# Patient Record
Sex: Male | Born: 1960 | Race: Black or African American | Hispanic: No | Marital: Single | Smoking: Current every day smoker
Health system: Southern US, Community
[De-identification: ages and names within clinical notes are randomized; demographics above are authoritative.]

## PROBLEM LIST (undated history)

## (undated) DIAGNOSIS — E119 Type 2 diabetes mellitus without complications: Secondary | ICD-10-CM

## (undated) DIAGNOSIS — I10 Essential (primary) hypertension: Secondary | ICD-10-CM

## (undated) DIAGNOSIS — B20 Human immunodeficiency virus [HIV] disease: Secondary | ICD-10-CM

## (undated) DIAGNOSIS — Z21 Asymptomatic human immunodeficiency virus [HIV] infection status: Secondary | ICD-10-CM

---

## 2001-03-19 ENCOUNTER — Emergency Department (HOSPITAL_COMMUNITY): Admission: EM | Admit: 2001-03-19 | Discharge: 2001-03-19 | Payer: Self-pay | Admitting: Emergency Medicine

## 2001-03-19 ENCOUNTER — Encounter: Payer: Self-pay | Admitting: Emergency Medicine

## 2001-05-28 ENCOUNTER — Emergency Department (HOSPITAL_COMMUNITY): Admission: EM | Admit: 2001-05-28 | Discharge: 2001-05-28 | Payer: Self-pay | Admitting: Emergency Medicine

## 2001-11-17 ENCOUNTER — Emergency Department (HOSPITAL_COMMUNITY): Admission: EM | Admit: 2001-11-17 | Discharge: 2001-11-17 | Payer: Self-pay | Admitting: *Deleted

## 2001-12-29 ENCOUNTER — Inpatient Hospital Stay (HOSPITAL_COMMUNITY): Admission: EM | Admit: 2001-12-29 | Discharge: 2002-01-08 | Payer: Self-pay | Admitting: Emergency Medicine

## 2001-12-29 ENCOUNTER — Encounter: Payer: Self-pay | Admitting: Emergency Medicine

## 2002-01-01 ENCOUNTER — Encounter: Payer: Self-pay | Admitting: Internal Medicine

## 2002-02-13 ENCOUNTER — Encounter: Admission: RE | Admit: 2002-02-13 | Discharge: 2002-02-13 | Payer: Self-pay | Admitting: Internal Medicine

## 2002-04-04 ENCOUNTER — Encounter: Admission: RE | Admit: 2002-04-04 | Discharge: 2002-04-04 | Payer: Self-pay | Admitting: Internal Medicine

## 2005-07-27 ENCOUNTER — Ambulatory Visit: Payer: Self-pay | Admitting: Specialist

## 2005-12-10 ENCOUNTER — Ambulatory Visit: Payer: Self-pay | Admitting: Specialist

## 2005-12-13 ENCOUNTER — Inpatient Hospital Stay: Payer: Self-pay | Admitting: Specialist

## 2006-12-28 ENCOUNTER — Emergency Department: Payer: Self-pay

## 2007-03-22 ENCOUNTER — Ambulatory Visit: Payer: Self-pay | Admitting: Specialist

## 2008-01-25 ENCOUNTER — Ambulatory Visit: Payer: Self-pay | Admitting: Specialist

## 2008-03-20 ENCOUNTER — Inpatient Hospital Stay: Payer: Self-pay | Admitting: Internal Medicine

## 2009-04-23 ENCOUNTER — Emergency Department: Payer: Self-pay | Admitting: Emergency Medicine

## 2009-07-14 ENCOUNTER — Ambulatory Visit: Payer: Self-pay | Admitting: Internal Medicine

## 2009-08-01 ENCOUNTER — Emergency Department: Payer: Self-pay | Admitting: Emergency Medicine

## 2010-02-12 ENCOUNTER — Ambulatory Visit: Payer: Self-pay | Admitting: Internal Medicine

## 2010-03-03 ENCOUNTER — Inpatient Hospital Stay: Payer: Self-pay | Admitting: Internal Medicine

## 2010-03-07 ENCOUNTER — Ambulatory Visit: Payer: Self-pay | Admitting: Internal Medicine

## 2010-03-15 ENCOUNTER — Ambulatory Visit: Payer: Self-pay | Admitting: Internal Medicine

## 2010-07-08 ENCOUNTER — Emergency Department: Payer: Self-pay | Admitting: Emergency Medicine

## 2011-02-26 ENCOUNTER — Inpatient Hospital Stay: Payer: Self-pay | Admitting: Psychiatry

## 2011-05-22 IMAGING — CR DG CHEST 2V
1 series · 2 of 2 positions shown · non-contrast
Comparison: none

REASON FOR EXAM: fever
COMMENTS:

PROCEDURE:     DXR - DXR CHEST PA (OR AP) AND LATERAL  - March 03, 2010  [DATE]
RESULT:     Comparison: 03/20/2008

[Series 1: view not recorded · 0.17mm/px · 2 of 2 slices shown]
[im 1/2]
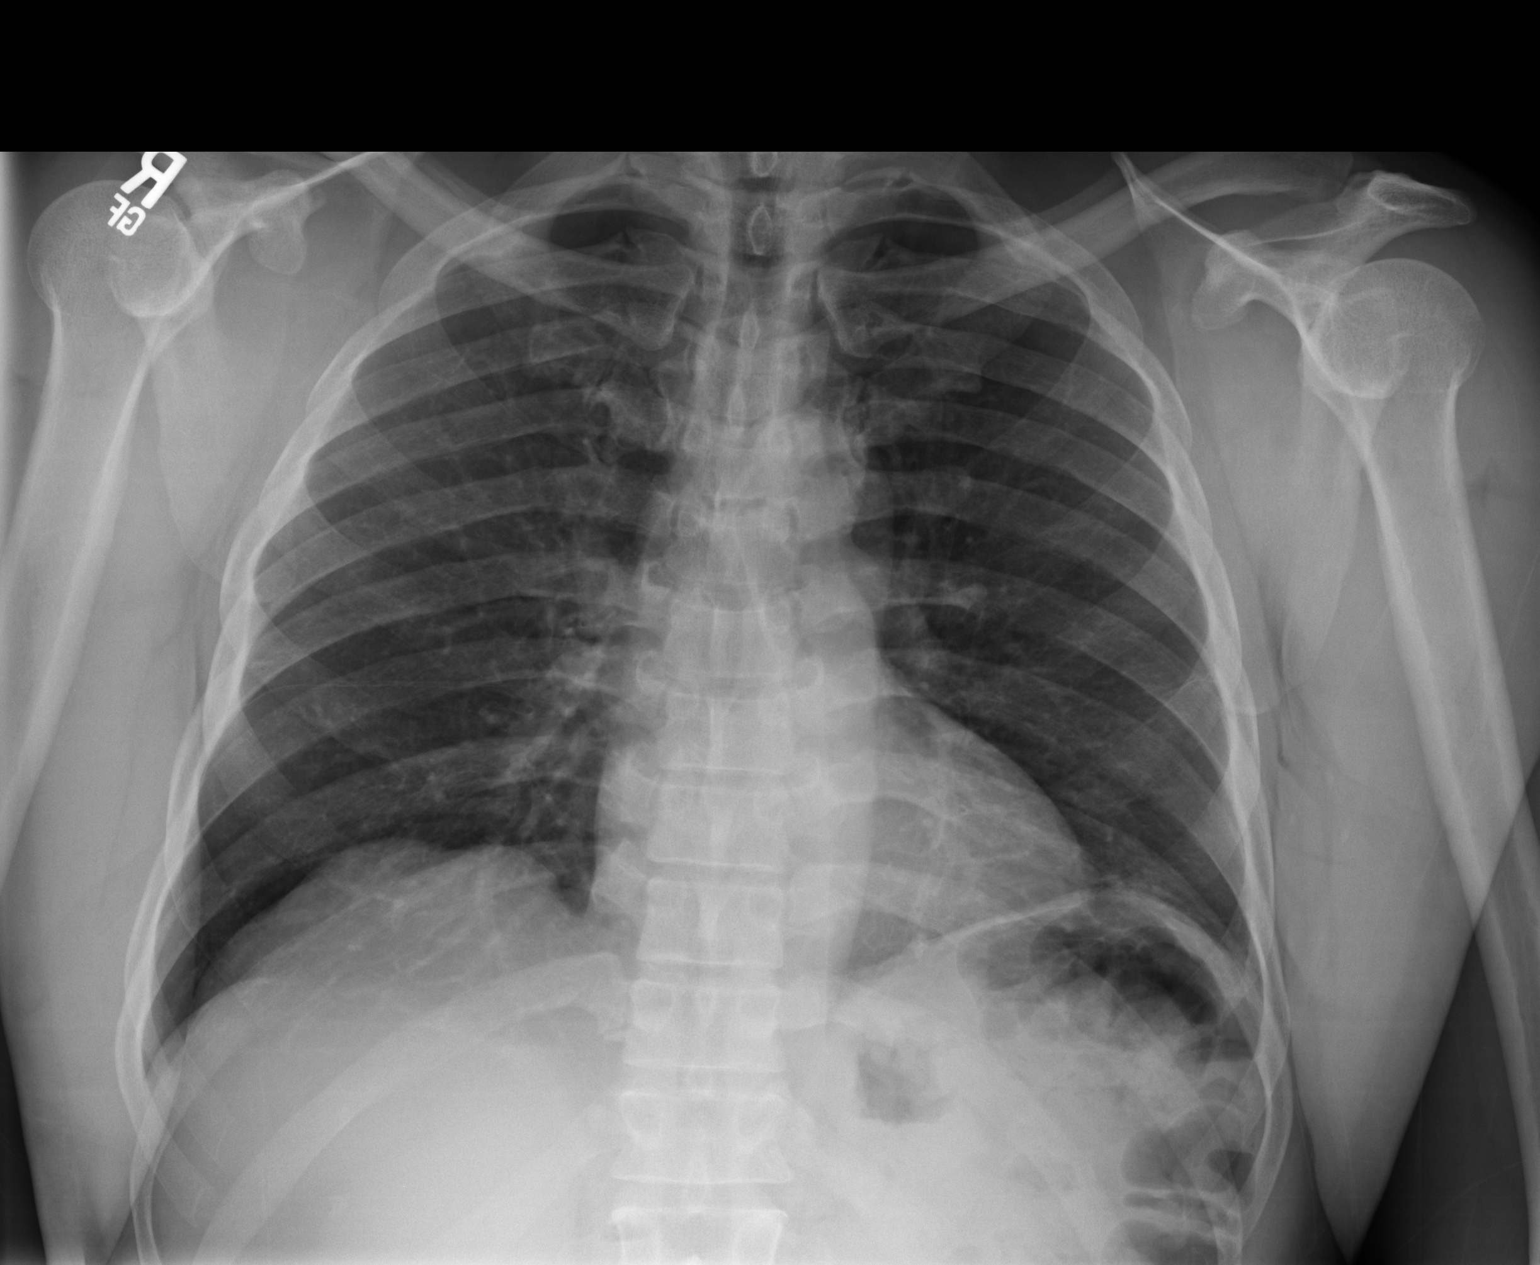
[im 2/2]
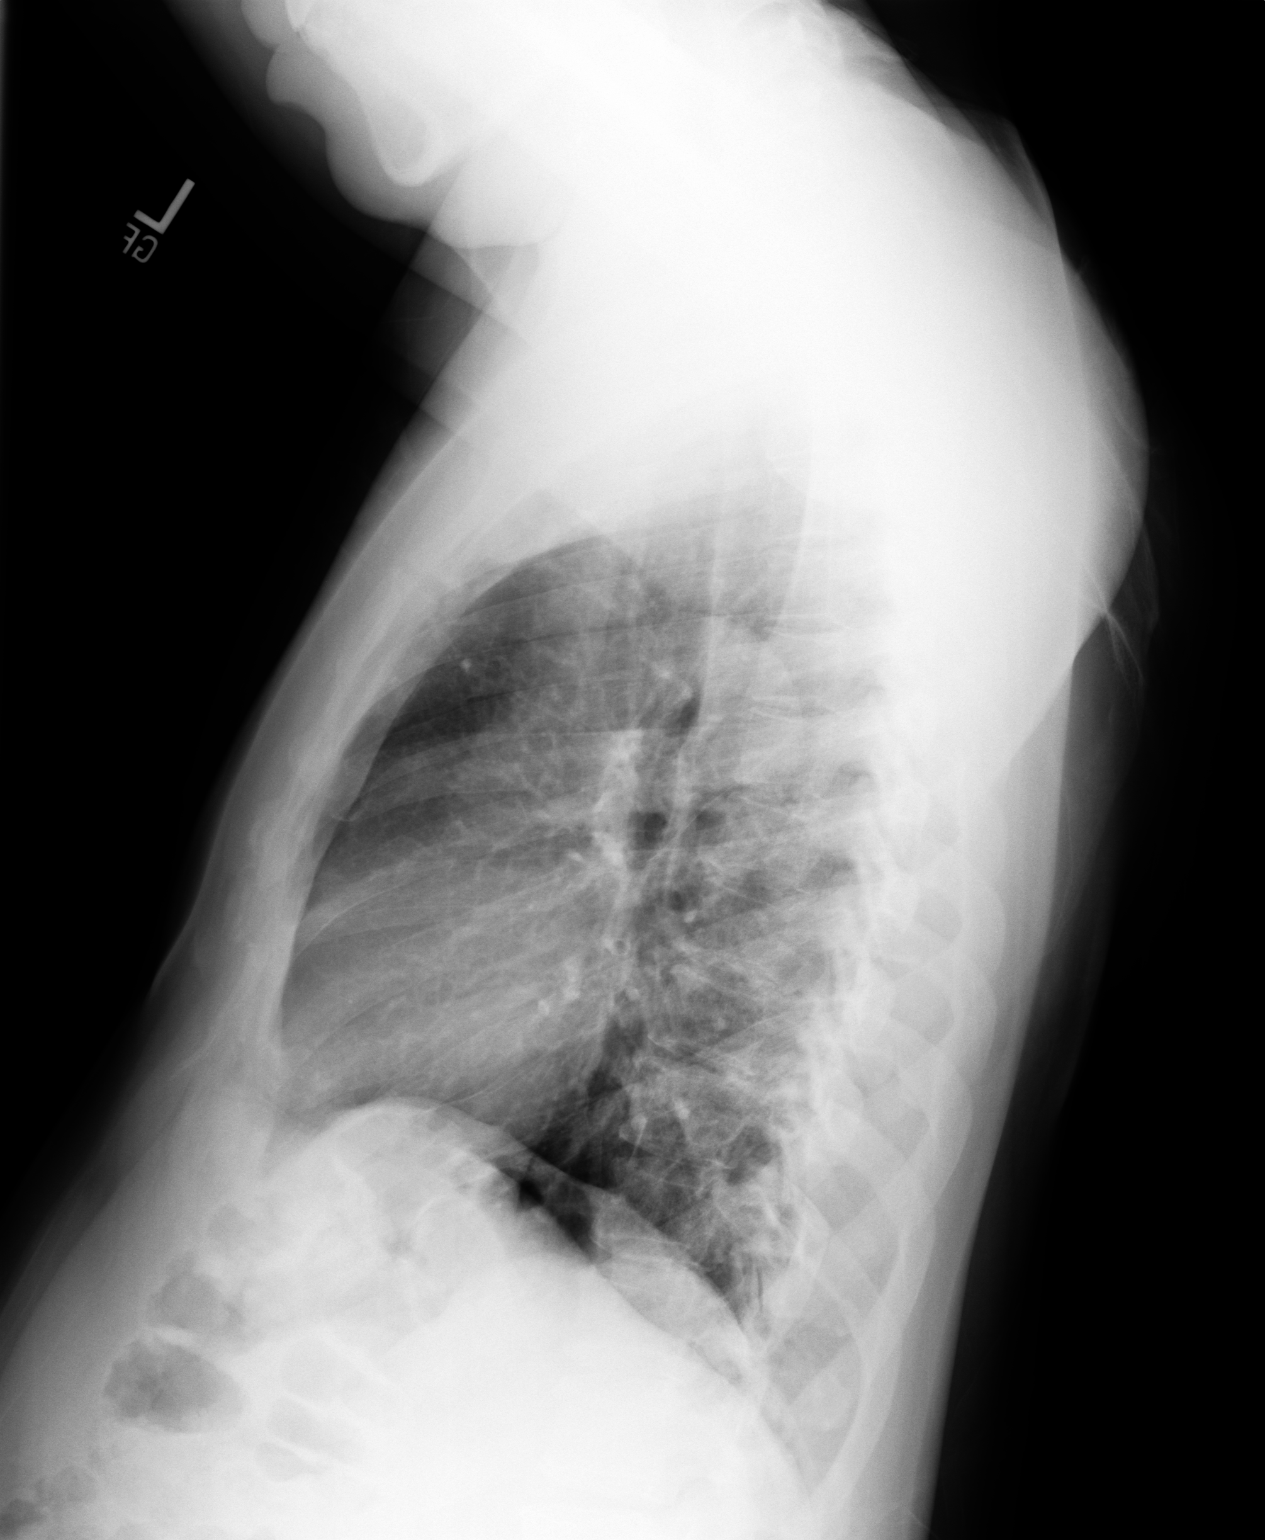

[2 of 2 positions shown; findings below may reference images not displayed]

FINDINGS: Heart and mediastinum are stable. Minimal linear left basilar opacities
likely represent subsegmental atelectasis. Otherwise, the lungs are clear.
IMPRESSION: Minimal left basilar subsegmental atelectasis.

## 2011-06-07 ENCOUNTER — Emergency Department: Payer: Self-pay

## 2011-06-07 LAB — URINALYSIS, COMPLETE
Bacteria: NONE SEEN
Bilirubin,UR: NEGATIVE
Glucose,UR: >=500 mg/dL (ref 0–75)
Ketone: NEGATIVE
Leukocyte Esterase: NEGATIVE
Nitrite: NEGATIVE
Ph: 5 (ref 4.5–8.0)
Protein: NEGATIVE
RBC,UR: <1 /HPF (ref 0–5)
Specific Gravity: 1.027 (ref 1.003–1.030)
Squamous Epithelial: NONE SEEN
WBC UR: 1 /HPF (ref 0–5)

## 2011-06-07 LAB — CBC
HCT: 39.1 % — ABNORMAL LOW (ref 40.0–52.0)
HGB: 13.5 g/dL (ref 13.0–18.0)
MCH: 33.6 pg (ref 26.0–34.0)
MCHC: 34.4 g/dL (ref 32.0–36.0)
MCV: 98 fL (ref 80–100)
Platelet: 223 10*3/uL (ref 150–440)
RBC: 4 10*6/uL — ABNORMAL LOW (ref 4.40–5.90)
RDW: 13.8 % (ref 11.5–14.5)
WBC: 5.4 10*3/uL (ref 3.8–10.6)

## 2011-06-07 LAB — COMPREHENSIVE METABOLIC PANEL
Albumin: 3.8 g/dL (ref 3.4–5.0)
Alkaline Phosphatase: 106 U/L (ref 50–136)
Anion Gap: 16 (ref 7–16)
BUN: 22 mg/dL — ABNORMAL HIGH (ref 7–18)
Bilirubin,Total: 0.2 mg/dL (ref 0.2–1.0)
Calcium, Total: 9 mg/dL (ref 8.5–10.1)
Chloride: 98 mmol/L (ref 98–107)
Co2: 22 mmol/L (ref 21–32)
Creatinine: 1.33 mg/dL — ABNORMAL HIGH (ref 0.60–1.30)
EGFR (African American): >60
EGFR (Non-African Amer.): >60
Glucose: 535 mg/dL (ref 65–99)
Osmolality: 300 (ref 275–301)
Potassium: 4 mmol/L (ref 3.5–5.1)
SGOT(AST): 31 U/L (ref 15–37)
SGPT (ALT): 30 U/L
Sodium: 136 mmol/L (ref 136–145)
Total Protein: 7.8 g/dL (ref 6.4–8.2)

## 2015-11-13 ENCOUNTER — Other Ambulatory Visit: Payer: Self-pay

## 2015-11-13 ENCOUNTER — Other Ambulatory Visit (HOSPITAL_COMMUNITY)
Admission: RE | Admit: 2015-11-13 | Discharge: 2015-11-13 | Disposition: A | Discharge status: Home / Self Care | Payer: Medicare Other | Source: Ambulatory Visit | Attending: Internal Medicine | Admitting: Internal Medicine

## 2015-11-13 DIAGNOSIS — Z79899 Other long term (current) drug therapy: Secondary | ICD-10-CM

## 2015-11-13 DIAGNOSIS — Z113 Encounter for screening for infections with a predominantly sexual mode of transmission: Secondary | ICD-10-CM | POA: Diagnosis present

## 2015-11-13 DIAGNOSIS — B20 Human immunodeficiency virus [HIV] disease: Secondary | ICD-10-CM

## 2015-11-14 LAB — CBC WITH DIFFERENTIAL/PLATELET
Basophils Absolute: 0 cells/uL (ref 0–200)
Basophils Relative: 0 %
EOS PCT: 0 %
Eosinophils Absolute: 0 cells/uL — ABNORMAL LOW (ref 15–500)
HEMATOCRIT: 52.8 % — AB (ref 38.5–50.0)
Hemoglobin: 18.6 g/dL — ABNORMAL HIGH (ref 13.2–17.1)
LYMPHS PCT: 42 %
Lymphs Abs: 2016 cells/uL (ref 850–3900)
MCH: 34.1 pg — ABNORMAL HIGH (ref 27.0–33.0)
MCHC: 35.2 g/dL (ref 32.0–36.0)
MCV: 96.9 fL (ref 80.0–100.0)
MONOS PCT: 8 %
MPV: 10 fL (ref 7.5–12.5)
Monocytes Absolute: 384 cells/uL (ref 200–950)
NEUTROS PCT: 50 %
Neutro Abs: 2400 cells/uL (ref 1500–7800)
PLATELETS: 309 10*3/uL (ref 140–400)
RBC: 5.45 MIL/uL (ref 4.20–5.80)
RDW: 13.8 % (ref 11.0–15.0)
WBC: 4.8 10*3/uL (ref 3.8–10.8)

## 2015-11-14 LAB — URINALYSIS
Bilirubin Urine: NEGATIVE
HGB URINE DIPSTICK: NEGATIVE
KETONES UR: NEGATIVE
Leukocytes, UA: NEGATIVE
NITRITE: NEGATIVE
PH: 5 (ref 5.0–8.0)
Specific Gravity, Urine: 1.024 (ref 1.001–1.035)

## 2015-11-14 LAB — COMPLETE METABOLIC PANEL WITH GFR
ALT: 21 U/L (ref 9–46)
AST: 18 U/L (ref 10–35)
Albumin: 5 g/dL (ref 3.6–5.1)
Alkaline Phosphatase: 82 U/L (ref 40–115)
BUN: 21 mg/dL (ref 7–25)
CALCIUM: 10.3 mg/dL (ref 8.6–10.3)
CHLORIDE: 103 mmol/L (ref 98–110)
CO2: 24 mmol/L (ref 20–31)
Creat: 1.27 mg/dL (ref 0.70–1.33)
GFR, Est African American: 74 mL/min (ref >=60)
GFR, Est Non African American: 64 mL/min (ref >=60)
Glucose, Bld: 207 mg/dL — ABNORMAL HIGH (ref 65–99)
POTASSIUM: 4.3 mmol/L (ref 3.5–5.3)
SODIUM: 141 mmol/L (ref 135–146)
Total Bilirubin: 0.6 mg/dL (ref 0.2–1.2)
Total Protein: 7.8 g/dL (ref 6.1–8.1)

## 2015-11-14 LAB — HEPATITIS C ANTIBODY: HCV AB: NEGATIVE

## 2015-11-14 LAB — URINE CYTOLOGY ANCILLARY ONLY
CHLAMYDIA, DNA PROBE: NEGATIVE
NEISSERIA GONORRHEA: NEGATIVE

## 2015-11-14 LAB — RPR

## 2015-11-14 LAB — T-HELPER CELL (CD4) - (RCID CLINIC ONLY)
CD4 % Helper T Cell: 10 % — ABNORMAL LOW (ref 33–55)
CD4 T CELL ABS: 210 /uL — AB (ref 400–2700)

## 2015-11-14 LAB — LIPID PANEL
Cholesterol: 208 mg/dL — ABNORMAL HIGH (ref 125–200)
HDL: 38 mg/dL — ABNORMAL LOW (ref >=40)
LDL CALC: 118 mg/dL (ref <130)
TRIGLYCERIDES: 262 mg/dL — AB (ref <150)
Total CHOL/HDL Ratio: 5.5 Ratio — ABNORMAL HIGH (ref <=5.0)
VLDL: 52 mg/dL — ABNORMAL HIGH (ref <30)

## 2015-11-14 LAB — HIV-1 RNA ULTRAQUANT REFLEX TO GENTYP+: HIV-1 RNA Quant, Log: <1.3 Log copies/mL (ref <1.30)

## 2015-11-14 LAB — HEPATITIS B CORE ANTIBODY, TOTAL: HEP B C TOTAL AB: REACTIVE — AB

## 2015-11-14 LAB — HEPATITIS B SURFACE ANTIBODY,QUALITATIVE: Hep B S Ab: POSITIVE — AB

## 2015-11-14 LAB — HEPATITIS A ANTIBODY, TOTAL: Hep A Total Ab: REACTIVE — AB

## 2015-11-14 LAB — HEPATITIS B SURFACE ANTIGEN: Hepatitis B Surface Ag: NEGATIVE

## 2015-11-15 LAB — QUANTIFERON TB GOLD ASSAY (BLOOD)
INTERFERON GAMMA RELEASE ASSAY: NEGATIVE
Mitogen-Nil: >10 IU/mL
Quantiferon Nil Value: 0.3 IU/mL
Quantiferon Tb Ag Minus Nil Value: <0 IU/mL

## 2015-11-20 ENCOUNTER — Emergency Department (HOSPITAL_COMMUNITY)
Admission: EM | Admit: 2015-11-20 | Discharge: 2015-11-21 | Disposition: A | Discharge status: Home / Self Care | Payer: Medicare Other | Attending: Emergency Medicine | Admitting: Emergency Medicine

## 2015-11-20 ENCOUNTER — Encounter (HOSPITAL_COMMUNITY): Payer: Self-pay | Admitting: Emergency Medicine

## 2015-11-20 DIAGNOSIS — E119 Type 2 diabetes mellitus without complications: Secondary | ICD-10-CM | POA: Insufficient documentation

## 2015-11-20 DIAGNOSIS — R1030 Lower abdominal pain, unspecified: Secondary | ICD-10-CM | POA: Diagnosis present

## 2015-11-20 DIAGNOSIS — R1084 Generalized abdominal pain: Secondary | ICD-10-CM

## 2015-11-20 HISTORY — DX: Type 2 diabetes mellitus without complications: E11.9

## 2015-11-20 HISTORY — DX: Essential (primary) hypertension: I10

## 2015-11-20 LAB — LIPASE, BLOOD: Lipase: 30 U/L (ref 11–51)

## 2015-11-20 LAB — URINALYSIS, ROUTINE W REFLEX MICROSCOPIC
Bilirubin Urine: NEGATIVE
GLUCOSE, UA: 500 mg/dL — AB
HGB URINE DIPSTICK: NEGATIVE
KETONES UR: 15 mg/dL — AB
LEUKOCYTES UA: NEGATIVE
Nitrite: NEGATIVE
PH: 8 (ref 5.0–8.0)
Protein, ur: 30 mg/dL — AB
Specific Gravity, Urine: 1.025 (ref 1.005–1.030)

## 2015-11-20 LAB — URINE MICROSCOPIC-ADD ON: RBC / HPF: NONE SEEN RBC/hpf (ref 0–5)

## 2015-11-20 LAB — CBC WITH DIFFERENTIAL/PLATELET
Basophils Absolute: 0 10*3/uL (ref 0.0–0.1)
Basophils Relative: 1 %
Eosinophils Absolute: 0 10*3/uL (ref 0.0–0.7)
Eosinophils Relative: 1 %
HEMATOCRIT: 44.3 % (ref 39.0–52.0)
Hemoglobin: 15.1 g/dL (ref 13.0–17.0)
LYMPHS PCT: 31 %
Lymphs Abs: 1.2 10*3/uL (ref 0.7–4.0)
MCH: 34 pg (ref 26.0–34.0)
MCHC: 34.1 g/dL (ref 30.0–36.0)
MCV: 99.8 fL (ref 78.0–100.0)
MONO ABS: 0.5 10*3/uL (ref 0.1–1.0)
MONOS PCT: 14 %
NEUTROS ABS: 2.1 10*3/uL (ref 1.7–7.7)
Neutrophils Relative %: 53 %
Platelets: 217 10*3/uL (ref 150–400)
RBC: 4.44 MIL/uL (ref 4.22–5.81)
RDW: 12.3 % (ref 11.5–15.5)
WBC: 3.8 10*3/uL — ABNORMAL LOW (ref 4.0–10.5)

## 2015-11-20 LAB — COMPREHENSIVE METABOLIC PANEL
ALBUMIN: 3.5 g/dL (ref 3.5–5.0)
ALK PHOS: 64 U/L (ref 38–126)
ALT: 33 U/L (ref 17–63)
ANION GAP: 6 (ref 5–15)
AST: 34 U/L (ref 15–41)
BUN: 6 mg/dL (ref 6–20)
CALCIUM: 9.1 mg/dL (ref 8.9–10.3)
CO2: 30 mmol/L (ref 22–32)
Chloride: 102 mmol/L (ref 101–111)
Creatinine, Ser: 0.97 mg/dL (ref 0.61–1.24)
GFR calc Af Amer: >60 mL/min (ref >60)
GFR calc non Af Amer: >60 mL/min (ref >60)
GLUCOSE: 215 mg/dL — AB (ref 65–99)
Potassium: 3.8 mmol/L (ref 3.5–5.1)
SODIUM: 138 mmol/L (ref 135–145)
Total Bilirubin: 0.3 mg/dL (ref 0.3–1.2)
Total Protein: 6.3 g/dL — ABNORMAL LOW (ref 6.5–8.1)

## 2015-11-20 MED ORDER — HYDROMORPHONE HCL 1 MG/ML IJ SOLN
1.0000 mg | Freq: Once | INTRAMUSCULAR | Status: AC
  Administered 2015-11-21: 1 mg via INTRAVENOUS
  Filled 2015-11-20: qty 1

## 2015-11-20 MED ORDER — ONDANSETRON HCL 4 MG/2ML IJ SOLN
4.0000 mg | Freq: Once | INTRAMUSCULAR | Status: AC
  Administered 2015-11-21: 4 mg via INTRAVENOUS
  Filled 2015-11-20: qty 2

## 2015-11-20 NOTE — ED Notes (Signed)
updated

## 2015-11-20 NOTE — ED Provider Notes (Signed)
[facility]-EMERGENCY DEPT Provider Note   CSN: [id number] Arrival date & time: 11/20/15  1507  By signing my name below, I, Clovis PuAvnee Patel, attest that this documentation has been prepared under the direction and in the presence of Gilda Creasehristopher J Ashli Selders, MD  Electronically Signed: Clovis PuAvnee Patel, ED Scribe. 11/20/15. 11:33 PM.    History   Chief Complaint Chief Complaint  Patient presents with  . Abdominal Pain  . Constipation     The history is provided by the patient. No language interpreter was used.    HPI Comments:  Devin Rivera is a 55 y.o. male who presents to the Emergency Department complaining of acute onset, constant central, upper abdominal pain onset 2 days ago. Pt also describes a shooting pain around his lower abdomen. Pt states the lower abdominal pain radiates to his back. Pt reports associated change in BM but is unsure if he has constipation. Pt denies a hx of abdominal surgeries and vomiting. No alleviating factors noted.     Past Medical History:  Diagnosis Date  . Diabetes mellitus without complication (HCC)     There are no active problems to display for this patient.   No past surgical history on file.  OB History    No data available       Home Medications    Prior to Admission medications   Not on File    Family History No family history on file.  Social History Social History  Substance Use Topics  . Smoking status: Not on file  . Smokeless tobacco: Not on file  . Alcohol use Not on file     Allergies   Glipizide and Metformin and related   Review of Systems Review of Systems  Constitutional: Negative for fever.  Gastrointestinal: Positive for abdominal pain and constipation. Negative for vomiting.  Skin: Positive for rash.  All other systems reviewed and are negative.    Physical Exam Updated Vital Signs BP (!) 189/105 (BP Location: Right Arm)   Pulse 62   Temp 98 F (36.7 C) (Oral)   Resp 16   Ht 5\' 4"  (1.626  m)   Wt 158 lb (71.7 kg)   SpO2 99%   BMI 27.12 kg/m   Physical Exam  Constitutional: He is oriented to person, place, and time. He appears well-developed and well-nourished. No distress.  HENT:  Head: Normocephalic and atraumatic.  Right Ear: Hearing normal.  Left Ear: Hearing normal.  Nose: Nose normal.  Mouth/Throat: Oropharynx is clear and moist and mucous membranes are normal.  Eyes: Conjunctivae and EOM are normal. Pupils are equal, round, and reactive to light.  Neck: Normal range of motion. Neck supple.  Cardiovascular: Regular rhythm, S1 normal and S2 normal.  Exam reveals no gallop and no friction rub.   No murmur heard. Pulmonary/Chest: Effort normal and breath sounds normal. No respiratory distress. He exhibits no tenderness.  Abdominal: Soft. Normal appearance and bowel sounds are normal. There is no hepatosplenomegaly. There is no tenderness. There is no rebound, no guarding, no tenderness at McBurney's point and negative Murphy's sign. No hernia.  mildy distended and diffusely tender   Musculoskeletal: Normal range of motion.  Neurological: He is alert and oriented to person, place, and time. He has normal strength. No cranial nerve deficit or sensory deficit. Coordination normal. GCS eye subscore is 4. GCS verbal subscore is 5. GCS motor subscore is 6.  Skin: Skin is warm, dry and intact. There is erythema. No cyanosis.  Small erythematous papules  with excoriations on the scalp.   Psychiatric: He has a normal mood and affect. His speech is normal and behavior is normal. Thought content normal.  Nursing note and vitals reviewed.    ED Treatments / Results  DIAGNOSTIC STUDIES:  Oxygen Saturation is 99% on RA, normal by my interpretation.    COORDINATION OF CARE:  11:28 PM Discussed treatment plan with pt at bedside and pt agreed to plan.  Labs (all labs ordered are listed, but only abnormal results are displayed) Labs Reviewed  COMPREHENSIVE METABOLIC PANEL -  Abnormal; Notable for the following:       Result Value   Glucose, Bld 215 (*)    Total Protein 6.3 (*)    All other components within normal limits  URINALYSIS, ROUTINE W REFLEX MICROSCOPIC (NOT AT [facility]) - Abnormal; Notable for the following:    APPearance CLOUDY (*)    Glucose, UA 500 (*)    Ketones, ur 15 (*)    Protein, ur 30 (*)    All other components within normal limits  CBC WITH DIFFERENTIAL/PLATELET - Abnormal; Notable for the following:    WBC 3.8 (*)    All other components within normal limits  URINE MICROSCOPIC-ADD ON - Abnormal; Notable for the following:    Squamous Epithelial / LPF 0-5 (*)    Bacteria, UA FEW (*)    All other components within normal limits  LIPASE, BLOOD    EKG  EKG Interpretation None       Radiology No results found.  Procedures Procedures (including critical care time)  Medications Ordered in ED Medications - No data to display   Initial Impression / Assessment and Plan / ED Course  I have reviewed the triage vital signs and the nursing notes.  Pertinent labs & imaging results that were available during my care of the patient were reviewed by me and considered in my medical decision making (see chart for details).  Clinical Course   Patient presents to the emergency part for evaluation of abdominal pain has been ongoing for 2 days. He reports that he has not been eating much, but has also noticed that he has not had a bowel movement. He was not sure if he was constipated, took milk of magnesia without improvement. Patient did have diffuse tenderness on examination. Lab work was normal. CT scan was therefore performed. No acute abnormality noted. I did review the images myself, no signs of constipation. Does have a lot of gas throughout is likely causing some of the pain.  Final Clinical Impressions(s) / ED Diagnoses   Final diagnoses:  None  Abdominal Pain  New Prescriptions New Prescriptions   No medications on file  I  personally performed the services described in this documentation, which was scribed in my presence. The recorded information has been reviewed and is accurate.     Gilda Crease, MD 11/21/15 [phone]

## 2015-11-20 NOTE — ED Triage Notes (Signed)
Pt reports constant generalized abdominal pain for two days. Pt reports he is also constipated, last BM was this morning. Pt reports it was a small bm with dark stools. Denies nausea. Pt reports taking milk of magnesia without relief 12 hours ago.

## 2015-11-21 ENCOUNTER — Encounter (HOSPITAL_COMMUNITY): Payer: Self-pay | Admitting: Radiology

## 2015-11-21 ENCOUNTER — Emergency Department (HOSPITAL_COMMUNITY): Payer: Medicare Other

## 2015-11-21 DIAGNOSIS — R1030 Lower abdominal pain, unspecified: Secondary | ICD-10-CM | POA: Diagnosis not present

## 2015-11-21 LAB — HLA B*5701: HLA-B*5701 w/rflx HLA-B High: NEGATIVE

## 2015-11-21 MED ORDER — SIMETHICONE 80 MG PO CHEW: 80.0000 mg | CHEWABLE_TABLET | Freq: Four times a day (QID) | ORAL | 0 refills | Status: AC | PRN

## 2015-11-21 MED ORDER — TRAMADOL HCL 50 MG PO TABS: 50.0000 mg | ORAL_TABLET | Freq: Four times a day (QID) | ORAL | 0 refills | Status: AC | PRN

## 2015-11-21 MED ORDER — IOPAMIDOL (ISOVUE-300) INJECTION 61%
INTRAVENOUS | Status: AC
  Administered 2015-11-21: 100 mL
  Filled 2015-11-21: qty 100

## 2015-11-21 NOTE — ED Notes (Signed)
Patient left at this time with all belongings. 

## 2015-11-27 ENCOUNTER — Encounter: Payer: Self-pay | Admitting: Internal Medicine

## 2015-11-27 ENCOUNTER — Ambulatory Visit (INDEPENDENT_AMBULATORY_CARE_PROVIDER_SITE_OTHER): Payer: Medicare Other | Admitting: Internal Medicine

## 2015-11-27 DIAGNOSIS — N529 Male erectile dysfunction, unspecified: Secondary | ICD-10-CM | POA: Diagnosis not present

## 2015-11-27 DIAGNOSIS — B20 Human immunodeficiency virus [HIV] disease: Secondary | ICD-10-CM | POA: Diagnosis not present

## 2015-11-27 MED ORDER — ELVITEG-COBIC-EMTRICIT-TENOFAF 150-150-200-10 MG PO TABS: 1.0000 | ORAL_TABLET | Freq: Every day | ORAL | 5 refills | Status: DC

## 2015-11-27 NOTE — Progress Notes (Signed)
Patient ID: Devin Rivera, male    DOB: Jul 17, 1960, 55 y.o.   MRN: [id number]  Reason for visit: to establish care as a new patient with HIV  HPI:   Patient was first diagnosed in [id number].  He was tested as part of illness then.  He says he was started then on AZT alone and has been on multiple regimens in the past including Sustiva with an unknown medication, older medications and most recently for the past 3 years has been on Prezista/norvir/Truvada.  [facility] and then was only [place] [facility] before coming here at their insistence.  He tells me he goes through periods of depression and stops his medications for 2-3 months before going back on them.  He did that recently and his viral load in July was 200 copies.  Now his CD4 is 210 and viral load undetectable.  No recent illnesses.  He is hopeful to get medication for ED but was told he needed me to review the interaction on a booster like norvir.    Past Medical History:  Diagnosis Date  . Diabetes mellitus without complication (HCC)   . Hypertension     Prior to Admission medications   Medication Sig Start Date End Date Taking? Authorizing Provider  enalapril (VASOTEC) 20 MG tablet Take 20 mg by mouth daily.   Yes Historical Provider, MD  hydrochlorothiazide (HYDRODIURIL) 25 MG tablet Take 25 mg by mouth daily. Unsure of dose   Yes Historical Provider, MD  insulin NPH-regular Human (NOVOLIN 70/30) (70-30) 100 UNIT/ML injection Inject into the skin.   Yes Historical Provider, MD  elvitegravir-cobicistat-emtricitabine-tenofovir (Devin Rivera) 150-150-200-10 MG TABS tablet Take 1 tablet by mouth daily. 11/27/15   Gardiner Barefootobert W Gazella Anglin, MD  simethicone (GAS-X) 80 MG chewable tablet Chew 1 tablet (80 mg total) by mouth every 6 (six) hours as needed (abd pain). Patient not taking: Reported on 11/27/2015 11/21/15   Gilda Creasehristopher J Pollina, MD  traMADol (ULTRAM) 50 MG tablet Take 1 tablet (50 mg total) by mouth  every 6 (six) hours as needed for moderate pain. 11/21/15   Gilda Creasehristopher J Pollina, MD    Allergies  Allergen Reactions  . Glipizide   . Metformin And Related     Social History  Substance Use Topics  . Smoking status: Current Every Day Smoker    Packs/day: 0.50    Start date: 03/15/1985  . Smokeless tobacco: Not on file  . Alcohol use Yes     Comment: occasional    FMHx: does not know his father but thinks there was DM; no renal disease or diabetes on mothers side  Review of Systems Constitutional: negative for malaise and anorexia Gastrointestinal: negative for diarrhea Integument/breast: negative for rash Musculoskeletal: negative for myalgias and arthralgias All other systems reviewed and are negative   CONSTITUTIONAL:in no apparent distress and alert  Vitals:   11/27/15 1356  BP: (!) 155/103  Pulse: 71  Temp: 97.7 F (36.5 C)   EYES: anicteric HENT: no thrush CARD:Cor RRR RESP:CTA B; normal respiratory effort QI:ONGEXGI:Bowel sounds are normal, liver is not enlarged, spleen is not enlarged MS:no pedal edema noted SKIN: no rashes NEURO: non focal  Lab Results  Component Value Date   HIV1RNAQUANT <20 11/13/2015   No components found for: HIV1GENOTYPRPLUS No components found for: THELPERCELL  Assessment: new patient here with long-standing HIV on multiple previous regimens, currently suppressed.  Discussed with patient treatment options and side effects, benefits of  treatment, long term outcomes.  I discussed the severity of untreated HIV including higher cancer risk, opportunistic infections, renal failure.  Also discussed needing to use condoms, partner disclosure, necessary vaccines, blood monitoring.  I discussed one pill a day options and need to stay compliant and potential resistance with missed doses.  All questions answered.    Plan: 1) start Devin Rivera with next refill 2) stop Prezista, norvir, truvada 3) ok for any treatment for ED including viagra on Devin Rivera -  interaction alerts that come up with this and similar ED medication are not for this drug but for infusions of NO-enhancers used for pulmonary hypertension of the same pharmacologic mechanism.  ED drugs are used safely with Devin Rivera.  RTC 2 months for follow up labs on new regimen

## 2015-12-17 ENCOUNTER — Encounter: Payer: Self-pay | Admitting: Licensed Clinical Social Worker

## 2016-01-08 ENCOUNTER — Other Ambulatory Visit: Payer: Medicare Other

## 2016-01-12 ENCOUNTER — Other Ambulatory Visit: Payer: Medicare Other

## 2016-01-14 ENCOUNTER — Other Ambulatory Visit: Payer: Medicare Other

## 2016-01-14 DIAGNOSIS — B20 Human immunodeficiency virus [HIV] disease: Secondary | ICD-10-CM

## 2016-01-14 LAB — COMPLETE METABOLIC PANEL WITH GFR
ALBUMIN: 4.4 g/dL (ref 3.6–5.1)
ALK PHOS: 77 U/L (ref 40–115)
ALT: 15 U/L (ref 9–46)
AST: 18 U/L (ref 10–35)
BILIRUBIN TOTAL: 0.5 mg/dL (ref 0.2–1.2)
BUN: 16 mg/dL (ref 7–25)
CO2: 25 mmol/L (ref 20–31)
CREATININE: 1.22 mg/dL (ref 0.70–1.33)
Calcium: 9.8 mg/dL (ref 8.6–10.3)
Chloride: 105 mmol/L (ref 98–110)
GFR, Est African American: 77 mL/min (ref >=60)
GFR, Est Non African American: 67 mL/min (ref >=60)
GLUCOSE: 150 mg/dL — AB (ref 65–99)
Potassium: 4.2 mmol/L (ref 3.5–5.3)
SODIUM: 141 mmol/L (ref 135–146)
TOTAL PROTEIN: 7 g/dL (ref 6.1–8.1)

## 2016-01-15 LAB — CBC WITH DIFFERENTIAL/PLATELET
BASOS ABS: 0 {cells}/uL (ref 0–200)
Basophils Relative: 0 %
EOS ABS: 53 {cells}/uL (ref 15–500)
EOS PCT: 1 %
HCT: 49 % (ref 38.5–50.0)
Hemoglobin: 17 g/dL (ref 13.2–17.1)
LYMPHS PCT: 43 %
Lymphs Abs: 2279 cells/uL (ref 850–3900)
MCH: 34.6 pg — AB (ref 27.0–33.0)
MCHC: 34.7 g/dL (ref 32.0–36.0)
MCV: 99.6 fL (ref 80.0–100.0)
MONOS PCT: 9 %
MPV: 9.3 fL (ref 7.5–12.5)
Monocytes Absolute: 477 cells/uL (ref 200–950)
NEUTROS PCT: 47 %
Neutro Abs: 2491 cells/uL (ref 1500–7800)
PLATELETS: 281 10*3/uL (ref 140–400)
RBC: 4.92 MIL/uL (ref 4.20–5.80)
RDW: 13.5 % (ref 11.0–15.0)
WBC: 5.3 10*3/uL (ref 3.8–10.8)

## 2016-01-15 LAB — T-HELPER CELL (CD4) - (RCID CLINIC ONLY)
CD4 T CELL ABS: 260 /uL — AB (ref 400–2700)
CD4 T CELL HELPER: 10 % — AB (ref 33–55)

## 2016-01-19 LAB — HIV-1 RNA QUANT-NO REFLEX-BLD: HIV 1 RNA Quant: <20 copies/mL (ref <20)

## 2016-02-02 ENCOUNTER — Ambulatory Visit: Payer: Medicare Other | Admitting: Internal Medicine

## 2016-07-23 ENCOUNTER — Other Ambulatory Visit: Payer: Self-pay | Admitting: Internal Medicine

## 2016-07-23 ENCOUNTER — Other Ambulatory Visit: Payer: Self-pay | Admitting: *Deleted

## 2016-07-23 MED ORDER — ELVITEG-COBIC-EMTRICIT-TENOFAF 150-150-200-10 MG PO TABS: 1.0000 | ORAL_TABLET | Freq: Every day | ORAL | 0 refills | Status: DC

## 2016-07-29 ENCOUNTER — Encounter: Payer: Self-pay | Admitting: Internal Medicine

## 2016-07-29 ENCOUNTER — Ambulatory Visit (INDEPENDENT_AMBULATORY_CARE_PROVIDER_SITE_OTHER): Payer: Medicare Other | Admitting: Internal Medicine

## 2016-07-29 VITALS — BP 200/121 | HR 69 | Temp 98.4°F | Ht 66.0 in | Wt 165.0 lb

## 2016-07-29 DIAGNOSIS — B20 Human immunodeficiency virus [HIV] disease: Secondary | ICD-10-CM | POA: Diagnosis not present

## 2016-07-29 DIAGNOSIS — I10 Essential (primary) hypertension: Secondary | ICD-10-CM | POA: Diagnosis not present

## 2016-07-29 DIAGNOSIS — Z720 Tobacco use: Secondary | ICD-10-CM | POA: Insufficient documentation

## 2016-07-29 MED ORDER — ENALAPRIL MALEATE 20 MG PO TABS: 20.0000 mg | ORAL_TABLET | Freq: Every day | ORAL | 0 refills | Status: AC

## 2016-07-29 MED ORDER — ENALAPRIL MALEATE 20 MG PO TABS: 20.0000 mg | ORAL_TABLET | Freq: Every day | ORAL | 0 refills | Status: DC

## 2016-07-29 NOTE — Progress Notes (Signed)
   Subjective:    Patient ID: Devin Rivera, male    DOB: 1960-12-17, 56 y.o.   MRN: [id number]  HPI Here for follow up of HIV. He was a new patient in September 2017 and was on Prezista, norvir and [place] and I changed him to [place].  He is pleased with this but did not come back after the change of therapy until now.  No labs prior to the visit. 2 missed doses since September.  No associated n/v/d.  Has not been on his bp medication since he did not establish with a PCP. No headache.     Review of Systems  Gastrointestinal: Negative for diarrhea.  Skin: Negative for rash.  Neurological: Negative for dizziness, light-headedness and headaches.       Objective:   Physical Exam  Constitutional: He appears well-developed and well-nourished. No distress.  HENT:  Mouth/Throat: No oropharyngeal exudate.  Eyes: No scleral icterus.  Cardiovascular: Normal rate, regular rhythm and normal heart sounds.   No murmur heard. Pulmonary/Chest: Effort normal and breath sounds normal. No respiratory distress.  Lymphadenopathy:    He has no cervical adenopathy.  Skin: No rash noted.    SH: smokes     Assessment & Plan:

## 2016-07-29 NOTE — Assessment & Plan Note (Addendum)
I advised him, particularly with his current blood pressure, that he needs to quit smoking now and is at high risk for stroke, mi He is precontemplative and not interested in any interventions.

## 2016-07-29 NOTE — Assessment & Plan Note (Signed)
His bp today is very high and I gave him 0.2 mg clonidine and refilled his enalapril which he is going to pick up today from [facility].  I asked him to stay to get his bp down some but he refused.  He is asymptomatic.  He has a new pcp appt next week.

## 2016-07-29 NOTE — Assessment & Plan Note (Signed)
Doing well.  Labs today and rtc  Months unless concerns.

## 2016-07-30 LAB — T-HELPER CELL (CD4) - (RCID CLINIC ONLY)
CD4 % Helper T Cell: 14 % — ABNORMAL LOW (ref 33–55)
CD4 T CELL ABS: 330 /uL — AB (ref 400–2700)

## 2016-07-31 LAB — HIV-1 RNA QUANT-NO REFLEX-BLD
HIV 1 RNA Quant: <20 copies/mL
HIV-1 RNA Quant, Log: <1.3 Log copies/mL

## 2016-12-21 ENCOUNTER — Other Ambulatory Visit: Payer: Self-pay | Admitting: Internal Medicine

## 2016-12-27 ENCOUNTER — Other Ambulatory Visit: Payer: Self-pay | Admitting: Internal Medicine

## 2017-02-01 ENCOUNTER — Ambulatory Visit: Payer: Medicare Other | Admitting: Internal Medicine

## 2017-03-22 ENCOUNTER — Other Ambulatory Visit: Payer: Self-pay | Admitting: Internal Medicine

## 2017-04-14 ENCOUNTER — Other Ambulatory Visit: Payer: Medicare Other

## 2017-04-14 ENCOUNTER — Other Ambulatory Visit: Payer: Self-pay | Admitting: *Deleted

## 2017-04-14 DIAGNOSIS — B20 Human immunodeficiency virus [HIV] disease: Secondary | ICD-10-CM

## 2017-04-14 DIAGNOSIS — Z113 Encounter for screening for infections with a predominantly sexual mode of transmission: Secondary | ICD-10-CM

## 2017-04-28 ENCOUNTER — Other Ambulatory Visit (HOSPITAL_COMMUNITY)
Admission: RE | Admit: 2017-04-28 | Discharge: 2017-04-28 | Disposition: A | Discharge status: Home / Self Care | Payer: Medicare Other | Source: Ambulatory Visit | Attending: Internal Medicine | Admitting: Internal Medicine

## 2017-04-28 ENCOUNTER — Ambulatory Visit (INDEPENDENT_AMBULATORY_CARE_PROVIDER_SITE_OTHER): Payer: Medicare Other | Admitting: Internal Medicine

## 2017-04-28 ENCOUNTER — Encounter: Payer: Self-pay | Admitting: Internal Medicine

## 2017-04-28 VITALS — BP 137/84 | HR 78 | Temp 98.0°F | Ht 66.0 in | Wt 161.0 lb

## 2017-04-28 DIAGNOSIS — Z79899 Other long term (current) drug therapy: Secondary | ICD-10-CM

## 2017-04-28 DIAGNOSIS — Z113 Encounter for screening for infections with a predominantly sexual mode of transmission: Secondary | ICD-10-CM

## 2017-04-28 DIAGNOSIS — Z720 Tobacco use: Secondary | ICD-10-CM | POA: Diagnosis not present

## 2017-04-28 DIAGNOSIS — B20 Human immunodeficiency virus [HIV] disease: Secondary | ICD-10-CM

## 2017-04-28 NOTE — Assessment & Plan Note (Signed)
Will screen today 

## 2017-04-28 NOTE — Progress Notes (Signed)
   Subjective:    Patient ID: Devin Rivera, male    DOB: 1960/06/26, 57 y.o.   MRN: [id number]  HPI Here for follow up of HIV. He was a new patient in September 2017 and was on Prezista, norvir and [place] and I changed him to [place].  He is pleased with this and no new issues.  Did not get labs prior to the appointment. No associated n/v/d.  Continues to see Dr. Maurice MarchBarroso in his study.  Currently suffering with a viral illness.    Review of Systems  Gastrointestinal: Negative for diarrhea.  Skin: Negative for rash.  Neurological: Negative for dizziness, light-headedness and headaches.       Objective:   Physical Exam  Constitutional: He appears well-developed and well-nourished. No distress.  HENT:  Mouth/Throat: No oropharyngeal exudate.  Eyes: No scleral icterus.  Cardiovascular: Normal rate, regular rhythm and normal heart sounds.  No murmur heard. Pulmonary/Chest: Effort normal and breath sounds normal. No respiratory distress.  Lymphadenopathy:    He has no cervical adenopathy.  Skin: No rash noted.    SH: smokes     Assessment & Plan:

## 2017-04-28 NOTE — Assessment & Plan Note (Signed)
Will check lipids today

## 2017-04-28 NOTE — Assessment & Plan Note (Signed)
Counseled.  precontemplative 

## 2017-04-28 NOTE — Assessment & Plan Note (Signed)
Doing well.  Labs today and rtc 6 months with lab prior

## 2017-04-29 ENCOUNTER — Encounter: Payer: Self-pay | Admitting: Infectious Disease

## 2017-04-29 ENCOUNTER — Telehealth: Payer: Self-pay | Admitting: *Deleted

## 2017-04-29 LAB — LIPID PANEL
CHOLESTEROL: 296 mg/dL — AB (ref <200)
HDL: 37 mg/dL — AB (ref >40)
Non-HDL Cholesterol (Calc): 259 mg/dL (calc) — ABNORMAL HIGH (ref <130)
TRIGLYCERIDES: 418 mg/dL — AB (ref <150)
Total CHOL/HDL Ratio: 8 (calc) — ABNORMAL HIGH (ref <5.0)

## 2017-04-29 LAB — CBC WITH DIFFERENTIAL/PLATELET
Basophils Absolute: 29 cells/uL (ref 0–200)
Basophils Relative: 0.6 %
EOS PCT: 0.6 %
Eosinophils Absolute: 29 cells/uL (ref 15–500)
HCT: 48 % (ref 38.5–50.0)
HEMOGLOBIN: 16.7 g/dL (ref 13.2–17.1)
Lymphs Abs: 1823 cells/uL (ref 850–3900)
MCH: 32.6 pg (ref 27.0–33.0)
MCHC: 34.8 g/dL (ref 32.0–36.0)
MCV: 93.8 fL (ref 80.0–100.0)
MONOS PCT: 8 %
MPV: 9.9 fL (ref 7.5–12.5)
NEUTROS ABS: 2626 {cells}/uL (ref 1500–7800)
Neutrophils Relative %: 53.6 %
Platelets: 368 10*3/uL (ref 140–400)
RBC: 5.12 10*6/uL (ref 4.20–5.80)
RDW: 12.3 % (ref 11.0–15.0)
Total Lymphocyte: 37.2 %
WBC mixed population: 392 cells/uL (ref 200–950)
WBC: 4.9 10*3/uL (ref 3.8–10.8)

## 2017-04-29 LAB — COMPLETE METABOLIC PANEL WITH GFR
AG Ratio: 1.5 (calc) (ref 1.0–2.5)
ALBUMIN MSPROF: 4.3 g/dL (ref 3.6–5.1)
ALT: 11 U/L (ref 9–46)
AST: 12 U/L (ref 10–35)
Alkaline phosphatase (APISO): 111 U/L (ref 40–115)
BILIRUBIN TOTAL: 0.4 mg/dL (ref 0.2–1.2)
BUN: 18 mg/dL (ref 7–25)
CALCIUM: 10.1 mg/dL (ref 8.6–10.3)
CO2: 27 mmol/L (ref 20–32)
Chloride: 97 mmol/L — ABNORMAL LOW (ref 98–110)
Creat: 1.33 mg/dL (ref 0.70–1.33)
GFR, EST AFRICAN AMERICAN: 69 mL/min/{1.73_m2} (ref >=60)
GFR, EST NON AFRICAN AMERICAN: 60 mL/min/{1.73_m2} (ref >=60)
GLOBULIN: 2.8 g/dL (ref 1.9–3.7)
Glucose, Bld: 500 mg/dL (ref 65–99)
Potassium: 4.6 mmol/L (ref 3.5–5.3)
Sodium: 135 mmol/L (ref 135–146)
TOTAL PROTEIN: 7.1 g/dL (ref 6.1–8.1)

## 2017-04-29 LAB — T-HELPER CELL (CD4) - (RCID CLINIC ONLY)
CD4 T CELL HELPER: 14 % — AB (ref 33–55)
CD4 T Cell Abs: 280 /uL — ABNORMAL LOW (ref 400–2700)

## 2017-04-29 LAB — RPR: RPR: NONREACTIVE

## 2017-04-29 LAB — URINE CYTOLOGY ANCILLARY ONLY
Chlamydia: NEGATIVE
Neisseria Gonorrhea: NEGATIVE

## 2017-04-29 NOTE — Telephone Encounter (Signed)
[facility] (PCP) to let them know of the alert blood sugar. He hasn't been to primary care since 2017. RN left message with endocrinology notifying them of his blood sugar result as well. RN called patient, left message encouraging him to follow up with his primary care [facility] [phone]).  Andree CossHowell, Zayed Griffie M, RN

## 2017-04-29 NOTE — Telephone Encounter (Signed)
-----   Message from Randall Hissornelius N Van Dam, MD sent at 04/29/2017  3:32 AM EST ----- Regarding: Bg of 500 I received phone call at 3:15 AM with re  this patient's blood glucose of 500. Please have him call his PCP today during business hrs to  have this treated. I continue to find it dysfunctional for us to answer phone calls 8 to 12 hours after labs drawn on patient on critical labs esp when it is at 3 in am

## 2017-04-29 NOTE — Telephone Encounter (Signed)
[facility] and stated he received a call from this number. Informed him of message that his blood sugar was high and that he needed to call endocrinology today. Gave him the number of [phone].

## 2017-04-29 NOTE — Progress Notes (Signed)
Received phone call from [facility] re pts BG of 500. Can we have him see PCP? This is pretty dysfunctional that we get these critical labs called at 3:15 in am to us on bloodwork drawn 8-12 hrs earlier

## 2017-05-01 LAB — HIV-1 RNA QUANT-NO REFLEX-BLD
HIV 1 RNA Quant: <20 copies/mL
HIV-1 RNA Quant, Log: <1.3 Log copies/mL

## 2017-06-27 ENCOUNTER — Other Ambulatory Visit: Payer: Self-pay | Admitting: Internal Medicine

## 2017-09-30 ENCOUNTER — Other Ambulatory Visit: Payer: Self-pay | Admitting: Internal Medicine

## 2017-10-18 ENCOUNTER — Ambulatory Visit: Payer: Medicare Other

## 2017-11-02 ENCOUNTER — Ambulatory Visit: Payer: Medicare Other | Admitting: Internal Medicine

## 2017-11-07 ENCOUNTER — Encounter (HOSPITAL_COMMUNITY): Payer: Self-pay | Admitting: Emergency Medicine

## 2017-11-07 ENCOUNTER — Emergency Department (HOSPITAL_COMMUNITY)
Admission: EM | Admit: 2017-11-07 | Discharge: 2017-11-08 | Disposition: A | Discharge status: Home / Self Care | Payer: Medicare Other | Attending: Emergency Medicine | Admitting: Emergency Medicine

## 2017-11-07 ENCOUNTER — Other Ambulatory Visit: Payer: Self-pay

## 2017-11-07 ENCOUNTER — Emergency Department (HOSPITAL_COMMUNITY): Payer: Medicare Other

## 2017-11-07 DIAGNOSIS — F17210 Nicotine dependence, cigarettes, uncomplicated: Secondary | ICD-10-CM | POA: Diagnosis not present

## 2017-11-07 DIAGNOSIS — Z794 Long term (current) use of insulin: Secondary | ICD-10-CM | POA: Diagnosis not present

## 2017-11-07 DIAGNOSIS — E1165 Type 2 diabetes mellitus with hyperglycemia: Secondary | ICD-10-CM | POA: Insufficient documentation

## 2017-11-07 DIAGNOSIS — R0602 Shortness of breath: Secondary | ICD-10-CM | POA: Diagnosis present

## 2017-11-07 DIAGNOSIS — I10 Essential (primary) hypertension: Secondary | ICD-10-CM | POA: Insufficient documentation

## 2017-11-07 DIAGNOSIS — R05 Cough: Secondary | ICD-10-CM | POA: Insufficient documentation

## 2017-11-07 DIAGNOSIS — Z79899 Other long term (current) drug therapy: Secondary | ICD-10-CM | POA: Insufficient documentation

## 2017-11-07 DIAGNOSIS — R739 Hyperglycemia, unspecified: Secondary | ICD-10-CM

## 2017-11-07 LAB — I-STAT CHEM 8, ED
BUN: 6 mg/dL (ref 6–20)
CHLORIDE: 100 mmol/L (ref 98–111)
Calcium, Ion: 1.14 mmol/L — ABNORMAL LOW (ref 1.15–1.40)
Creatinine, Ser: 0.8 mg/dL (ref 0.61–1.24)
Glucose, Bld: 300 mg/dL — ABNORMAL HIGH (ref 70–99)
HEMATOCRIT: 42 % (ref 39.0–52.0)
Hemoglobin: 14.3 g/dL (ref 13.0–17.0)
Potassium: 3.8 mmol/L (ref 3.5–5.1)
SODIUM: 135 mmol/L (ref 135–145)
TCO2: 25 mmol/L (ref 22–32)

## 2017-11-07 MED ORDER — ACETAMINOPHEN 325 MG PO TABS
650.00 | ORAL_TABLET | ORAL | Status: DC
Start: ? — End: 2017-11-07

## 2017-11-07 MED ORDER — HALOPERIDOL 5 MG PO TABS
5.00 | ORAL_TABLET | ORAL | Status: DC
Start: ? — End: 2017-11-07

## 2017-11-07 MED ORDER — HYDROXYZINE HCL 25 MG PO TABS
50.00 | ORAL_TABLET | ORAL | Status: DC
Start: ? — End: 2017-11-07

## 2017-11-07 MED ORDER — DIPHENHYDRAMINE HCL 50 MG/ML IJ SOLN
50.00 | INTRAMUSCULAR | Status: DC
Start: ? — End: 2017-11-07

## 2017-11-07 MED ORDER — ZIPRASIDONE MESYLATE 20 MG IM SOLR
20.00 | INTRAMUSCULAR | Status: DC
Start: ? — End: 2017-11-07

## 2017-11-07 MED ORDER — DEXTROSE 50 % IV SOLN
12.00 g | INTRAVENOUS | Status: DC
Start: ? — End: 2017-11-07

## 2017-11-07 MED ORDER — INSULIN LISPRO 100 UNIT/ML ~~LOC~~ SOLN
2.00 | SUBCUTANEOUS | Status: DC
Start: 2017-11-07 — End: 2017-11-07

## 2017-11-07 MED ORDER — LORAZEPAM 2 MG/ML IJ SOLN
2.00 | INTRAMUSCULAR | Status: DC
Start: ? — End: 2017-11-07

## 2017-11-07 MED ORDER — PROMETHAZINE HCL 25 MG PO TABS
25.00 | ORAL_TABLET | ORAL | Status: DC
Start: ? — End: 2017-11-07

## 2017-11-07 MED ORDER — ELVITEG-COBIC-EMTRICIT-TENOFAF 150-150-200-10 MG PO TABS
1.00 | ORAL_TABLET | ORAL | Status: DC
Start: 2017-11-08 — End: 2017-11-07

## 2017-11-07 MED ORDER — DOCUSATE SODIUM 100 MG PO CAPS
100.00 | ORAL_CAPSULE | ORAL | Status: DC
Start: ? — End: 2017-11-07

## 2017-11-07 MED ORDER — GENERIC EXTERNAL MEDICATION
5.00 | Status: DC
Start: ? — End: 2017-11-07

## 2017-11-07 MED ORDER — ALUMINUM-MAGNESIUM-SIMETHICONE 200-200-20 MG/5ML PO SUSP
30.00 | ORAL | Status: DC
Start: ? — End: 2017-11-07

## 2017-11-07 MED ORDER — LOPERAMIDE HCL 2 MG PO CAPS
2.00 | ORAL_CAPSULE | ORAL | Status: DC
Start: ? — End: 2017-11-07

## 2017-11-07 MED ORDER — TRAZODONE HCL 50 MG PO TABS
50.00 | ORAL_TABLET | ORAL | Status: DC
Start: ? — End: 2017-11-07

## 2017-11-07 MED ORDER — MIRTAZAPINE 15 MG PO TABS
7.50 | ORAL_TABLET | ORAL | Status: DC
Start: 2017-11-07 — End: 2017-11-07

## 2017-11-07 MED ORDER — SERTRALINE HCL 50 MG PO TABS
50.00 | ORAL_TABLET | ORAL | Status: DC
Start: 2017-11-08 — End: 2017-11-07

## 2017-11-07 MED ORDER — BENZTROPINE MESYLATE 1 MG PO TABS
1.00 | ORAL_TABLET | ORAL | Status: DC
Start: ? — End: 2017-11-07

## 2017-11-07 MED ORDER — NICOTINE 14 MG/24HR TD PT24
1.00 | MEDICATED_PATCH | TRANSDERMAL | Status: DC
Start: 2017-11-08 — End: 2017-11-07

## 2017-11-07 MED ORDER — CLONIDINE HCL 0.1 MG PO TABS
.10 | ORAL_TABLET | ORAL | Status: DC
Start: ? — End: 2017-11-07

## 2017-11-07 MED ORDER — GENERIC EXTERNAL MEDICATION
1.00 | Status: DC
Start: ? — End: 2017-11-07

## 2017-11-07 MED ORDER — ENALAPRIL MALEATE 10 MG PO TABS
20.00 | ORAL_TABLET | ORAL | Status: DC
Start: 2017-11-08 — End: 2017-11-07

## 2017-11-07 MED ORDER — CLOTRIMAZOLE 1 % EX CREA
TOPICAL_CREAM | CUTANEOUS | Status: DC
Start: 2017-11-07 — End: 2017-11-07

## 2017-11-07 NOTE — ED Triage Notes (Signed)
Pt presents for evaluation of HTN, states he has been out of his medication for same. C/o headache and intermittent shortness of breath. Denies chest pain.

## 2017-11-07 NOTE — ED Provider Notes (Signed)
[facility] EMERGENCY DEPARTMENT Provider Note   CSN: [id number] Arrival date & time: 11/07/17  1950     History   Chief Complaint Chief Complaint  Patient presents with  . Hypertension    HPI Devin Rivera is a 57 y.o. male.  HPI Patient presents to the emergency room for evaluation of cough and intermittent shortness of breath.  Patient states he has been bringing up some mucus and has been coughing a lot.  Patient also is concerned that his blood sugar and blood pressure were also elevated.  Patient states he is supposed to be on medications but has not been able to get them filled yet.  He has a prescription he supposed to be able to pick up tomorrow.  He denies any trouble with chest pain.  No problems with abdominal pain.  No vomiting or diarrhea.  No fevers or chills.  I attempted to use the sign language translator the patient does not read sign language.  He is able to stand and understand me if I speak loudly. Past Medical History:  Diagnosis Date  . Diabetes mellitus without complication (HCC)   . Hypertension     Patient Active Problem List   Diagnosis Date Noted  . Screening examination for venereal disease 04/28/2017  . Encounter for long-term (current) use of high-risk medication 04/28/2017  . HTN (hypertension) 07/29/2016  . Tobacco abuse 07/29/2016  . HIV infection, symptomatic (HCC) 11/27/2015  . Erectile dysfunction 11/27/2015    History reviewed. No pertinent surgical history.      Home Medications    Prior to Admission medications   Medication Sig Start Date End Date Taking? Authorizing Provider  amitriptyline (ELAVIL) 25 MG tablet Take by mouth. 06/08/13   [provider]  enalapril (VASOTEC) 20 MG tablet Take 1 tablet (20 mg total) by mouth daily. 07/29/16   Gardiner Barefoot, MD  GENVOYA 150-150-200-10 MG TABS tablet TAKE ONE TABLET BY MOUTH DAILY 09/30/17   Comer, Belia Heman, MD  hydrochlorothiazide (HYDRODIURIL)  25 MG tablet Take 25 mg by mouth daily. Unsure of dose    [provider]  insulin NPH-regular Human (NOVOLIN 70/30) (70-30) 100 UNIT/ML injection Inject into the skin.    [provider]  simethicone (GAS-X) 80 MG chewable tablet Chew 1 tablet (80 mg total) by mouth every 6 (six) hours as needed (abd pain). 11/21/15   Gilda Crease, MD  traMADol (ULTRAM) 50 MG tablet Take 1 tablet (50 mg total) by mouth every 6 (six) hours as needed for moderate pain. Patient not taking: Reported on 07/29/2016 11/21/15   Gilda Crease, MD    Family History No family history on file.  Social History Social History   Tobacco Use  . Smoking status: Current Every Day Smoker    Packs/day: 0.50    Start date: 03/15/1985  . Smokeless tobacco: Never Used  Substance Use Topics  . Alcohol use: Yes    Comment: occasional  . Drug use: Yes    Types: Marijuana    Comment: occasional     Allergies   Glipizide and Metformin and related   Review of Systems Review of Systems  Constitutional: Negative for fever.  Respiratory: Positive for cough.   Cardiovascular: Negative for chest pain.  Psychiatric/Behavioral:       Depression, no si  All other systems reviewed and are negative.    Physical Exam Updated Vital Signs BP (!) 140/104 (BP Location: Right Arm)   Pulse  64   Temp 98 F (36.7 C) (Oral)   Resp 16   SpO2 97%   Physical Exam  Constitutional: He appears well-developed and well-nourished. No distress.  HENT:  Head: Normocephalic and atraumatic.  Right Ear: External ear normal.  Left Ear: External ear normal.  Eyes: Conjunctivae are normal. Right eye exhibits no discharge. Left eye exhibits no discharge. No scleral icterus.  Neck: Neck supple. No tracheal deviation present.  Cardiovascular: Normal rate, regular rhythm and intact distal pulses.  Pulmonary/Chest: Effort normal and breath sounds normal. No stridor. No respiratory distress. He has no wheezes. He  has no rales.  Abdominal: Soft. Bowel sounds are normal. He exhibits no distension. There is no tenderness. There is no rebound and no guarding.  Musculoskeletal: He exhibits no edema or tenderness.  Neurological: He is alert. He has normal strength. No cranial nerve deficit (no facial droop, extraocular movements intact, no slurred speech) or sensory deficit. He exhibits normal muscle tone. He displays no seizure activity. Coordination normal.  Skin: Skin is warm and dry. No rash noted.  Psychiatric: He has a normal mood and affect.  Nursing note and vitals reviewed.    ED Treatments / Results  Labs (all labs ordered are listed, but only abnormal results are displayed) Labs Reviewed  I-STAT CHEM 8, ED - Abnormal; Notable for the following components:      Result Value   Glucose, Bld 300 (*)    Calcium, Ion 1.14 (*)    All other components within normal limits    EKG EKG Interpretation  Date/Time:  Monday November 07 2017 20:01:18 EDT Ventricular Rate:  57 PR Interval:  154 QRS Duration: 88 QT Interval:  402 QTC Calculation: 391 R Axis:   60 Text Interpretation:  Sinus bradycardia Cannot rule out Anterior infarct , age undetermined Abnormal ECG Confirmed by Linwood DibblesKnapp, Tricha Ruggirello [phone]([id number]) on 11/07/2017 11:55:08 PM   Radiology Dg Chest 2 View  Result Date: 11/07/2017 CLINICAL DATA:  Intermittent shortness of breath. EXAM: CHEST - 2 VIEW COMPARISON:  06/23/2015. FINDINGS: Poor inspiration. Minimal left basilar atelectasis. Otherwise, clear lungs. Unremarkable bones. IMPRESSION: Poor inspiration with minimal left basilar atelectasis. Electronically Signed   By: Beckie SaltsSteven  Reid M.D.   On: 11/07/2017 21:28    Procedures Procedures (including critical care time)  Medications Ordered in ED Medications - No data to display   Initial Impression / Assessment and Plan / ED Course  I have reviewed the triage vital signs and the nursing notes.  Pertinent labs & imaging results that were available  during my care of the patient were reviewed by me and considered in my medical decision making (see chart for details).  Clinical Course as of Aug 27 0000  Mon Nov 07, 2017  2354 Patient's laboratory tests are reassuring.  He does have hyperglycemia but no acidosis to suggest any acute complications.   [JK]  2354 Blood pressure is slightly elevated but no indication for any acute treatment   [JK]    Clinical Course User Index [JK] Linwood DibblesKnapp, Aisa Schoeppner, MD    Patient presented to the emergency room for various vague complaints.  In the emergency room he was calm and comfortable.  Physical exam is reassuring.  Chest x-ray does not show any evidence of pneumonia.  Electrolyte panel shows hyperglycemia but no signs of acidosis.  Patient admits he has not been taking his medications but believes he will be able to fill them tomorrow.  Patient also mentions some vague thoughts of depression but  according to the medical records he was just admitted to behavioral health hospital at [facility] on August 22.  I encouraged the patient to follow-up with mental health providers.  He should start taking his medications.  He appears stable for discharge.  Final Clinical Impressions(s) / ED Diagnoses   Final diagnoses:  Hyperglycemia    ED Discharge Orders    None       Linwood Dibbles, MD 11/08/17 0000

## 2017-11-07 NOTE — ED Provider Notes (Signed)
Patient placed in Quick Look pathway, seen and evaluated   Chief Complaint: SOB, HTN  HPI:   57 y.o. M who presents for evaluation of intermittent shortness of breath that is been ongoing today.  Patient states he has had some cough and mucus he has been coughing up.  Patient states that he ran out of his blood pressure medications.  He does not know the last time he has had home.  Patient reports earlier today he had a headache but denies any headache at this time.  Patient denies any chest pain, vision changes, abdominal pain.  History is limited secondary to patient's hearing impairment  ROS: SOB  Physical Exam:   Gen: No distress  Neuro: Awake and Alert  Skin: Warm    Focused Exam: Lungs CTAB   Initiation of care has begun. The patient has been counseled on the process, plan, and necessity for staying for the completion/evaluation, and the remainder of the medical screening examination    Rosana HoesLayden, Mattisen Pohlmann A, PA-C 11/07/17 2006    Rolan BuccoBelfi, Melanie, MD 11/07/17 2240

## 2017-11-07 NOTE — Discharge Instructions (Addendum)
Follow-up with your primary care doctor, continue your current medications °

## 2017-11-08 NOTE — ED Notes (Signed)
Patient verbalizes understanding of medications and discharge instructions. No further questions at this time. VSS and patient ambulatory at discharge.   

## 2017-11-08 NOTE — ED Notes (Signed)
Patient given turkey sandwich and water

## 2018-01-10 ENCOUNTER — Telehealth: Payer: Self-pay | Admitting: *Deleted

## 2018-01-10 DIAGNOSIS — B20 Human immunodeficiency virus [HIV] disease: Secondary | ICD-10-CM

## 2018-01-10 MED ORDER — ELVITEG-COBIC-EMTRICIT-TENOFAF 150-150-200-10 MG PO TABS: 1.0000 | ORAL_TABLET | Freq: Every day | ORAL | 0 refills | Status: DC

## 2018-01-10 NOTE — Telephone Encounter (Signed)
RN received fax request for refill of genvoya from new pharmacy [facility] [address] [place], [place]). Patient's demographics different on faxed request.  RN called patient. Voicemail message stated patient's name. RN left message asking patient to call back to connect to care. Andree Coss, RN

## 2018-01-23 ENCOUNTER — Other Ambulatory Visit: Payer: Self-pay | Admitting: Internal Medicine

## 2018-02-19 ENCOUNTER — Other Ambulatory Visit: Payer: Self-pay | Admitting: Internal Medicine

## 2018-02-19 DIAGNOSIS — B20 Human immunodeficiency virus [HIV] disease: Secondary | ICD-10-CM

## 2018-02-20 ENCOUNTER — Other Ambulatory Visit: Payer: Self-pay | Admitting: Internal Medicine

## 2018-02-20 DIAGNOSIS — B20 Human immunodeficiency virus [HIV] disease: Secondary | ICD-10-CM

## 2018-07-21 ENCOUNTER — Other Ambulatory Visit: Payer: Self-pay

## 2018-07-21 DIAGNOSIS — B20 Human immunodeficiency virus [HIV] disease: Secondary | ICD-10-CM

## 2018-07-21 MED ORDER — ELVITEG-COBIC-EMTRICIT-TENOFAF 150-150-200-10 MG PO TABS: 1.0000 | ORAL_TABLET | Freq: Every day | ORAL | 4 refills | Status: DC

## 2018-08-02 ENCOUNTER — Telehealth: Payer: Self-pay | Admitting: Pharmacist

## 2018-08-02 NOTE — Telephone Encounter (Signed)
Received information from patient's insurance company regarding a drug-drug interaction between patient's Genvoya and simvastatin. Those two medications are contraindicated together as there have been reports of severe toxicity including rhabdomyolysis-induced anuric kidney injury requiring hemodialysis with coadministration of Genvoya plus simvastatin 40 mg, which is the strength the patient is supposedly taking.  Patient has not been seen in our office in about a year and a half.  The last cholesterol medication I can see that he was prescribed was rosuvastatin, which is ok with Genvoya. The information from patient's insurance states that a Colin Ina, ARPN was the nurse who prescribed the simvastatin.  Called patient to discuss and there was no answer.

## 2019-04-20 ENCOUNTER — Other Ambulatory Visit: Payer: Self-pay

## 2019-04-20 DIAGNOSIS — Z20822 Contact with and (suspected) exposure to covid-19: Secondary | ICD-10-CM

## 2019-04-22 LAB — NOVEL CORONAVIRUS, NAA: SARS-CoV-2, NAA: NOT DETECTED

## 2019-05-14 ENCOUNTER — Other Ambulatory Visit: Payer: Self-pay

## 2019-05-14 ENCOUNTER — Encounter (HOSPITAL_BASED_OUTPATIENT_CLINIC_OR_DEPARTMENT_OTHER): Payer: Self-pay

## 2019-05-14 ENCOUNTER — Emergency Department (HOSPITAL_BASED_OUTPATIENT_CLINIC_OR_DEPARTMENT_OTHER)
Admission: EM | Admit: 2019-05-14 | Discharge: 2019-05-14 | Disposition: A | Discharge status: Home / Self Care | Payer: Medicare Other | Attending: Emergency Medicine | Admitting: Emergency Medicine

## 2019-05-14 ENCOUNTER — Emergency Department (HOSPITAL_BASED_OUTPATIENT_CLINIC_OR_DEPARTMENT_OTHER): Payer: Medicare Other

## 2019-05-14 DIAGNOSIS — Z7984 Long term (current) use of oral hypoglycemic drugs: Secondary | ICD-10-CM | POA: Insufficient documentation

## 2019-05-14 DIAGNOSIS — B370 Candidal stomatitis: Secondary | ICD-10-CM | POA: Diagnosis not present

## 2019-05-14 DIAGNOSIS — B20 Human immunodeficiency virus [HIV] disease: Secondary | ICD-10-CM | POA: Diagnosis not present

## 2019-05-14 DIAGNOSIS — I10 Essential (primary) hypertension: Secondary | ICD-10-CM | POA: Insufficient documentation

## 2019-05-14 DIAGNOSIS — R21 Rash and other nonspecific skin eruption: Secondary | ICD-10-CM | POA: Diagnosis present

## 2019-05-14 DIAGNOSIS — F320 Major depressive disorder, single episode, mild: Secondary | ICD-10-CM | POA: Diagnosis not present

## 2019-05-14 DIAGNOSIS — E119 Type 2 diabetes mellitus without complications: Secondary | ICD-10-CM | POA: Insufficient documentation

## 2019-05-14 DIAGNOSIS — Z888 Allergy status to other drugs, medicaments and biological substances status: Secondary | ICD-10-CM | POA: Diagnosis not present

## 2019-05-14 DIAGNOSIS — F17210 Nicotine dependence, cigarettes, uncomplicated: Secondary | ICD-10-CM | POA: Insufficient documentation

## 2019-05-14 DIAGNOSIS — Z79899 Other long term (current) drug therapy: Secondary | ICD-10-CM | POA: Insufficient documentation

## 2019-05-14 HISTORY — DX: Asymptomatic human immunodeficiency virus (hiv) infection status: Z21

## 2019-05-14 HISTORY — DX: Human immunodeficiency virus (HIV) disease: B20

## 2019-05-14 LAB — URINALYSIS, ROUTINE W REFLEX MICROSCOPIC
Glucose, UA: NEGATIVE mg/dL
Hgb urine dipstick: NEGATIVE
Ketones, ur: NEGATIVE mg/dL
Leukocytes,Ua: NEGATIVE
Nitrite: NEGATIVE
Protein, ur: 30 mg/dL — AB
Specific Gravity, Urine: >1.03 — ABNORMAL HIGH (ref 1.005–1.030)
pH: 6 (ref 5.0–8.0)

## 2019-05-14 LAB — COMPREHENSIVE METABOLIC PANEL
ALT: 16 U/L (ref 0–44)
AST: 20 U/L (ref 15–41)
Albumin: 3.2 g/dL — ABNORMAL LOW (ref 3.5–5.0)
Alkaline Phosphatase: 72 U/L (ref 38–126)
Anion gap: 8 (ref 5–15)
BUN: 10 mg/dL (ref 6–20)
CO2: 29 mmol/L (ref 22–32)
Calcium: 9.1 mg/dL (ref 8.9–10.3)
Chloride: 104 mmol/L (ref 98–111)
Creatinine, Ser: 0.67 mg/dL (ref 0.61–1.24)
GFR calc Af Amer: >60 mL/min (ref >60)
GFR calc non Af Amer: >60 mL/min (ref >60)
Glucose, Bld: 146 mg/dL — ABNORMAL HIGH (ref 70–99)
Potassium: 3.3 mmol/L — ABNORMAL LOW (ref 3.5–5.1)
Sodium: 141 mmol/L (ref 135–145)
Total Bilirubin: 0.4 mg/dL (ref 0.3–1.2)
Total Protein: 6.9 g/dL (ref 6.5–8.1)

## 2019-05-14 LAB — CBC WITH DIFFERENTIAL/PLATELET
Abs Immature Granulocytes: 0.04 10*3/uL (ref 0.00–0.07)
Basophils Absolute: 0 10*3/uL (ref 0.0–0.1)
Basophils Relative: 0 %
Eosinophils Absolute: 0 10*3/uL (ref 0.0–0.5)
Eosinophils Relative: 0 %
HCT: 38.4 % — ABNORMAL LOW (ref 39.0–52.0)
Hemoglobin: 12.2 g/dL — ABNORMAL LOW (ref 13.0–17.0)
Immature Granulocytes: 1 %
Lymphocytes Relative: 46 %
Lymphs Abs: 1.6 10*3/uL (ref 0.7–4.0)
MCH: 29.2 pg (ref 26.0–34.0)
MCHC: 31.8 g/dL (ref 30.0–36.0)
MCV: 91.9 fL (ref 80.0–100.0)
Monocytes Absolute: 0.3 10*3/uL (ref 0.1–1.0)
Monocytes Relative: 8 %
Neutro Abs: 1.5 10*3/uL — ABNORMAL LOW (ref 1.7–7.7)
Neutrophils Relative %: 45 %
Platelets: 322 10*3/uL (ref 150–400)
RBC: 4.18 MIL/uL — ABNORMAL LOW (ref 4.22–5.81)
RDW: 13 % (ref 11.5–15.5)
WBC: 3.4 10*3/uL — ABNORMAL LOW (ref 4.0–10.5)
nRBC: 0 % (ref 0.0–0.2)

## 2019-05-14 LAB — RAPID URINE DRUG SCREEN, HOSP PERFORMED
Amphetamines: NOT DETECTED
Barbiturates: NOT DETECTED
Benzodiazepines: NOT DETECTED
Cocaine: NOT DETECTED
Opiates: NOT DETECTED
Tetrahydrocannabinol: NOT DETECTED

## 2019-05-14 LAB — ACETAMINOPHEN LEVEL: Acetaminophen (Tylenol), Serum: <10 ug/mL — ABNORMAL LOW (ref 10–30)

## 2019-05-14 LAB — ETHANOL: Alcohol, Ethyl (B): <10 mg/dL (ref <10)

## 2019-05-14 LAB — URINALYSIS, MICROSCOPIC (REFLEX): RBC / HPF: NONE SEEN RBC/hpf (ref 0–5)

## 2019-05-14 LAB — SALICYLATE LEVEL: Salicylate Lvl: <7 mg/dL — ABNORMAL LOW (ref 7.0–30.0)

## 2019-05-14 MED ORDER — GENVOYA 150-150-200-10 MG PO TABS: 1.0000 | ORAL_TABLET | Freq: Every day | ORAL | 4 refills | Status: DC

## 2019-05-14 MED ORDER — NYSTATIN 100000 UNIT/ML MT SUSP: 500000.0000 [IU] | Freq: Four times a day (QID) | OROMUCOSAL | 0 refills | Status: DC

## 2019-05-14 MED ORDER — POTASSIUM CHLORIDE CRYS ER 20 MEQ PO TBCR
40.0000 meq | EXTENDED_RELEASE_TABLET | Freq: Once | ORAL | Status: AC
  Administered 2019-05-14: 18:00:00 40 meq via ORAL
  Filled 2019-05-14: qty 2

## 2019-05-14 MED ORDER — NYSTATIN 100000 UNIT/ML MT SUSP: 500000.0000 [IU] | Freq: Four times a day (QID) | OROMUCOSAL | 0 refills | Status: AC

## 2019-05-14 NOTE — Discharge Instructions (Addendum)
As we discussed, it is very important for you to take your medication for HIV.  Additionally, you need to arrange for an appointment with infectious disease to get you back on your medications and to have your labs rechecked.  Use nystatin as directed.  Swish and around but do not swallow it.  Spit it out to help with the thrush.  You provide been provided with additional resources to help you with shelter, depressive thoughts.  Return the emergency department for any thoughts of wanting to hurt or kill yourself, thoughts of wanting to hurt or kill anybody else, thoughts of depression, any other worsening concerning symptoms.

## 2019-05-14 NOTE — ED Notes (Signed)
PT Belongings locked up at nurses station

## 2019-05-14 NOTE — BH Assessment (Addendum)
Assessment Note  Devin Rivera is an 59 y.o. adult. He presents to [facility] with medical complaints (thrush of the mouth) and chest congestion. He was brought to the ED by his cousin. The cousin was present during the assessment to assist with communication, per patient's request. Patient stated that he felt ok ad trusted his cousin to help with the communication. 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 Patient offered additional services (e.g. sign language, interpreter, etc.). He declined. Stated, "I can only read lips and read if someone writes down what you are saying". Patient blurts out immediately, "I am not suicidal" and "I want to get out of here so I can get a cheese burger".   Patient's cousin explains the following: Patient has been missing since November 2020. No one has seen or heard from him.  His cousin reports about a week ago, he reached out to a family member via Facebook and told him where he was at.  Cousin states that he asked Mr. Devin Rivera where he has been all this time. Patient explains that he was staying at the [facility] and then told he had to leave. He was then put in a hotel via [facility] and that is where he was staying.  Family picked him up today because he was saying that he had a thrush infection and they wanted him to get checked out. He had been in [place] at a homeless ministry and then had gone to [facility] where he had been staying with another homeless ministry.  He did not like it there and so he left.  Last that they heard of him was about November 2020.    Per EDP's report patient stated that he voiced suicidal ideations. He mentioned putting himself on fire and also had no concern to take his medications. During the TTS assessment patient denied SI. He denied intent. No plan to harm himself. Stated that he made comments just because of his depression. States, "I'm down on my luck", "I'm homeless", "liviing on the streets".  Patient further shared that he is unable to trust people. Patient has no history of self harm. No history of harm to others. No HI. No history of harm to others. He is calm and cooperative. No AVH's. He does not appear to be responding to internal stimuli.  Patient denies drug use.   Patient is oriented to time, person, place, and situation. His speech was appropriate. His mood was appropriate has he was making jokes during the assessment about getting a cheese burger.   Patient's cousin plans to take him to a hotel upon discharge. States that he will further assist patient with a getting established at a boarding house in the next few days. Patient is willing to take medical medications as recommended by the medical providers. States he just needs help in getting back on track meaning he wants a doctor and referral to a doctor that can help him with his hearing.   Patient does not have a psychiatrist. He also does not have a therapist. Denies history of inpatient psychiatric admissions.   Diagnosis: Depressive Disorder, Mild  Past Medical History:  Past Medical History:  Diagnosis Date  . Diabetes mellitus without complication ([place])   . HIV (human immunodeficiency virus infection) ([place])   . Hypertension     History reviewed. No pertinent surgical history.  Family History: No family history on file.  Social History:  reports that she has  been smoking. She started smoking about 34 years ago. She has been smoking about 0.50 packs per day. She has never used smokeless tobacco. She reports current alcohol use. She reports current drug use. Drug: Marijuana.  Additional Social History:  Alcohol / Drug Use Pain Medications: SEE MAR Prescriptions: SEE MAR Over the Counter: SEE MAR History of alcohol / drug use?: (Patient denies but has a noted history fo THC and alcohol use.) Longest period of sobriety (when/how long): unk  CIWA: CIWA-Ar BP: 112/81 Pulse Rate: (!) 56 COWS:    Allergies:   Allergies  Allergen Reactions  . Glipizide   . Metformin And Related     Home Medications: (Not in a hospital admission)   OB/GYN Status:  No LMP recorded.  General Assessment Data Location of Assessment: [facility] TTS Assessment: In system Is this a Tele or Face-to-Face Assessment?: Tele Assessment Is this an Initial Assessment or a Re-assessment for this encounter?: Initial Assessment Patient Accompanied by:: (cousin) Language Other than English: No Living Arrangements: Homeless/Shelter What gender do you identify as?: Male Marital status: Single Maiden name: (n/a) Pregnancy Status: No Living Arrangements: Other (Comment) Can pt return to current living arrangement?: No Admission Status: Voluntary Is patient capable of signing voluntary admission?: Yes Referral Source: Self/Family/Friend     Crisis Care Plan Living Arrangements: Other (Comment) Legal Guardian: (no legal guardian) Name of Psychiatrist: (no psychiatrist ) Name of Therapist: (no therapist )  Education Status Is patient currently in school?: No Is the patient employed, unemployed or receiving disability?: Receiving disability income  Risk to self with the past 6 months Suicidal Ideation: No Has patient been a risk to self within the past 6 months prior to admission? : No Suicidal Intent: No Has patient had any suicidal intent within the past 6 months prior to admission? : No Is patient at risk for suicide?: No Suicidal Plan?: No Has patient had any suicidal plan within the past 6 months prior to admission? : No Access to Means: No What has been your use of drugs/alcohol within the last 12 months?: (denies; hx notes thc and alcohol use) Previous Attempts/Gestures: No How many times?: (0) Other Self Harm Risks: (no self harm ) Triggers for Past Attempts: (no prior attempts and/or gestures ) Intentional Self Injurious Behavior: None Family Suicide History: Unknown Recent stressful  life event(s): Other (Comment), Loss (Comment)(homeles; loss 2 pair of hearing aids $9000/pair) Persecutory voices/beliefs?: No Depression: Yes Depression Symptoms: Feeling angry/irritable, Loss of interest in usual pleasures, Fatigue, Isolating Substance abuse history and/or treatment for substance abuse?: No Suicide prevention information given to non-admitted patients: Not applicable  Risk to Others within the past 6 months Homicidal Ideation: No Does patient have any lifetime risk of violence toward others beyond the six months prior to admission? : No Thoughts of Harm to Others: No Current Homicidal Intent: No Current Homicidal Plan: No Access to Homicidal Means: No Identified Victim: (n/a) History of harm to others?: No Assessment of Violence: None Noted Violent Behavior Description: (no history of harm to others ) Does patient have access to weapons?: No Criminal Charges Pending?: No Does patient have a court date: No Is patient on probation?: No  Psychosis Hallucinations: None noted Delusions: Erotomanic  Mental Status Report Appearance/Hygiene: In hospital gown Eye Contact: Good Motor Activity: Freedom of movement Speech: Logical/coherent Level of Consciousness: Alert Mood: Depressed Affect: Appropriate to circumstance Anxiety Level: None Judgement: Unimpaired Orientation: Person, Place, Time, Situation Obsessive Compulsive Thoughts/Behaviors: None  Cognitive Functioning  Concentration: Decreased Memory: Recent Intact, Remote Intact Is patient IDD: No Insight: Fair Impulse Control: Fair Appetite: Poor Have you had any weight changes? : Loss Amount of the weight change? (lbs): (pt has loss 4 pounds in the past several months) Sleep: Decreased Total Hours of Sleep: (varies ) Vegetative Symptoms: None  ADLScreening [facility] Assessment Services) Patient's cognitive ability adequate to safely complete daily activities?: Yes Patient able to express need for  assistance with ADLs?: Yes Independently performs ADLs?: Yes (appropriate for developmental age)  Prior Inpatient Therapy Prior Inpatient Therapy: No  Prior Outpatient Therapy Prior Outpatient Therapy: No Prior Therapy Dates: (current) Prior Therapy Facilty/Provider(s): (no) Reason for Treatment: (no hx) Does patient have an ACCT team?: No Does patient have Intensive In-House Services?  : No Does patient have [facility] services? : No Does patient have P4CC services?: No  ADL Screening (condition at time of admission) Patient's cognitive ability adequate to safely complete daily activities?: Yes Is the patient deaf or have difficulty hearing?: Yes(Patient reads lips. He is hard of hearing. He has loss hearing aids.) Does the patient have difficulty seeing, even when wearing glasses/contacts?: No Does the patient have difficulty concentrating, remembering, or making decisions?: No Patient able to express need for assistance with ADLs?: Yes Does the patient have difficulty dressing or bathing?: No Independently performs ADLs?: Yes (appropriate for developmental age) Does the patient have difficulty walking or climbing stairs?: No Weakness of Legs: None Weakness of Arms/Hands: None  Home Assistive Devices/Equipment Home Assistive Devices/Equipment: None  Therapy Consults (therapy consults require a physician order) PT Evaluation Needed: No OT Evalulation Needed: No SLP Evaluation Needed: No Abuse/Neglect Assessment (Assessment to be complete while patient is alone) Physical Abuse: Denies Verbal Abuse: Denies Sexual Abuse: Denies Exploitation of patient/patient's resources: Denies Self-Neglect: Yes, present (Comment) Values / Beliefs Cultural Requests During Hospitalization: None Spiritual Requests During Hospitalization: None Consults Spiritual Care Consult Needed: No Transition of Care Team Consult Needed: No Advance Directives (For Healthcare) Does Patient Have a Medical  Advance Directive?: No Would patient like information on creating a medical advance directive?: No - Patient declined Nutrition Screen- [facility] Adult/WL/AP Patient's home diet: Regular Has the patient recently lost weight without trying?: Yes, 34 lbs. or greater(Per patient's cousin he has loss 40 pounds in the past several months) Has the patient been eating poorly because of a decreased appetite?: Yes Malnutrition Screening Tool Score: 5        Disposition: Per Shuvon Rankin, NP, patient is psych cleared. He is ok to discharge with outpatient referrals. Those referrals were faxed to [facility] for patient to follow up with and his cousin will assist as needed.  Disposition Initial Assessment Completed for this Encounter: Yes(Shuvon Rankin, NP recommends discharge; pt is psych cleared) Patient referred to: Outpatient clinic referral(Referrals provided for [facility], [facility])  On Site Evaluation by:   Reviewed with Physician:    Melynda Ripple 05/14/2019 6:51 PM

## 2019-05-14 NOTE — ED Triage Notes (Signed)
Pt arrives to ED with family who reports pt is deaf, pt will occassionally speak. Family reports pt has been missing for a few months and was found, is homeless. Pt speaks little but does report he has thrash in his mouth. Family would like a "whole evaluation".

## 2019-05-14 NOTE — ED Provider Notes (Signed)
[facility] EMERGENCY DEPARTMENT Provider Note   CSN: [id number] Arrival date & time: 05/14/19  1233     History Chief Complaint  Patient presents with  . Rash  . Psychiatric Evaluation    Devin Rivera is a 59 y.o. adult possible history of diabetes, HIV, hypertension brought in by cousin for concerns of depression, inability to care for himself, suicidal ideations.  Cousin states that patient has been missing since November 2020.  He had been in [place] at a homeless ministry and then had gone to [facility] where he had been staying with another homeless ministry.  He did not like it there and so he left.  Last that they heard of him was about November 2020.  They report about a week ago, he reached out to a family member via Facebook and told him where he was at.  He was put in a hotel via homeless group and that is where he was staying.  Family picked him up today because he was saying that he had a thrush infection and they wanted him to get checked out.  Patient does have a history of cochlear implant does not have his cochlear hearing device with him.  This severely limits the history and most of it is provided via the cousin writing notes and having the patient respond.  Patient states that he has been homeless for the last 4 months and this is severely contributed to his depression.  He states for the last 3 months, he has not taken any of his medications because "he just does not want to live anymore."  He states that he is depressed because "things just keep happening one right after the other."  He told a family member last week, he was thinking about setting himself on fire.  He denies any HI.  He has not had any fevers.  He has a cough that is productive of phlegm.  He reports some pain to his mouth secondary to the thrush lesions.  He has also had some diarrhea but denies any abdominal pain, vomiting.  No difficulty breathing.  The history is provided by the  patient.       Past Medical History:  Diagnosis Date  . Diabetes mellitus without complication (HCC)   . HIV (human immunodeficiency virus infection) (HCC)   . Hypertension     Patient Active Problem List   Diagnosis Date Noted  . Screening examination for venereal disease 04/28/2017  . Encounter for long-term (current) use of high-risk medication 04/28/2017  . HTN (hypertension) 07/29/2016  . Tobacco abuse 07/29/2016  . HIV infection, symptomatic (HCC) 11/27/2015  . Erectile dysfunction 11/27/2015    History reviewed. No pertinent surgical history.     No family history on file.  Social History   Tobacco Use  . Smoking status: Current Every Day Smoker    Packs/day: 0.50    Start date: 03/15/1985  . Smokeless tobacco: Never Used  Substance Use Topics  . Alcohol use: Yes    Comment: occasional  . Drug use: Yes    Types: Marijuana    Comment: occasional    Home Medications Prior to Admission medications   Medication Sig Start Date End Date Taking? Authorizing Provider  amitriptyline (ELAVIL) 25 MG tablet Take by mouth. 06/08/13   [provider]  elvitegravir-cobicistat-emtricitabine-tenofovir (GENVOYA) 150-150-200-10 MG TABS tablet Take 1 tablet by mouth daily. 05/14/19   Graciella Freer A, PA-C  enalapril (VASOTEC) 20 MG tablet Take 1 tablet (  20 mg total) by mouth daily. 07/29/16   Comer, Belia Heman, MD  hydrochlorothiazide (HYDRODIURIL) 25 MG tablet Take 25 mg by mouth daily. Unsure of dose    [provider]  insulin NPH-regular Human (NOVOLIN 70/30) (70-30) 100 UNIT/ML injection Inject into the skin.    [provider]  nystatin (MYCOSTATIN) 100000 UNIT/ML suspension Take 5 mLs (500,000 Units total) by mouth 4 (four) times daily. 05/14/19   Maxwell Caul, PA-C  simethicone (GAS-X) 80 MG chewable tablet Chew 1 tablet (80 mg total) by mouth every 6 (six) hours as needed (abd pain). 11/21/15   Gilda Crease, MD  traMADol (ULTRAM) 50 MG  tablet Take 1 tablet (50 mg total) by mouth every 6 (six) hours as needed for moderate pain. Patient not taking: Reported on 07/29/2016 11/21/15   Gilda Crease, MD    Allergies    Glipizide and Metformin and related  Review of Systems   Review of Systems  HENT: Positive for mouth sores.   Respiratory: Positive for cough. Negative for shortness of breath.   Cardiovascular: Negative for chest pain.  Gastrointestinal: Positive for diarrhea. Negative for abdominal pain, nausea and vomiting.  Genitourinary: Negative for dysuria and hematuria.  All other systems reviewed and are negative.   Physical Exam Updated Vital Signs BP 124/88 (BP Location: Left Arm)   Pulse 70   Temp 98.5 F (36.9 C) (Oral)   Resp 18   Ht 5\' 6"  (1.676 m)   Wt 51.6 kg   SpO2 99%   BMI 18.35 kg/m   Physical Exam Vitals and nursing note reviewed.  Constitutional:      Comments: Thin, sickly appearing.  HENT:     Head: Normocephalic and atraumatic.     Mouth/Throat:     Mouth: Oral lesions present.     Comments: Thrush noted on hard palate, tongue.  No evidence of ulcers, lesions. Eyes:     General: Lids are normal.     Conjunctiva/sclera: Conjunctivae normal.     Pupils: Pupils are equal, round, and reactive to light.  Cardiovascular:     Rate and Rhythm: Normal rate and regular rhythm.     Pulses: Normal pulses.     Heart sounds: Normal heart sounds. No murmur. No friction rub. No gallop.   Pulmonary:     Effort: Pulmonary effort is normal.     Breath sounds: Rhonchi present.     Comments: Diffuse rhonchi.  No evidence of respiratory distress.  No wheezing. Abdominal:     Palpations: Abdomen is soft. Abdomen is not rigid.     Tenderness: There is no abdominal tenderness. There is no guarding.     Comments: Abdomen is soft, non-distended, non-tender. No rigidity, No guarding. No peritoneal signs.  Musculoskeletal:        General: Normal range of motion.     Cervical back: Full passive  range of motion without pain.  Skin:    General: Skin is warm and dry.     Capillary Refill: Capillary refill takes less than 2 seconds.  Neurological:     Mental Status: She is alert and oriented to person, place, and time.  Psychiatric:        Speech: Speech normal.     ED Results / Procedures / Treatments   Labs (all labs ordered are listed, but only abnormal results are displayed) Labs Reviewed  COMPREHENSIVE METABOLIC PANEL - Abnormal; Notable for the following components:      Result Value  Potassium 3.3 (*)    Glucose, Bld 146 (*)    Albumin 3.2 (*)    All other components within normal limits  CBC WITH DIFFERENTIAL/PLATELET - Abnormal; Notable for the following components:   WBC 3.4 (*)    RBC 4.18 (*)    Hemoglobin 12.2 (*)    HCT 38.4 (*)    Neutro Abs 1.5 (*)    All other components within normal limits  ACETAMINOPHEN LEVEL - Abnormal; Notable for the following components:   Acetaminophen (Tylenol), Serum <10 (*)    All other components within normal limits  SALICYLATE LEVEL - Abnormal; Notable for the following components:   Salicylate Lvl <3.2 (*)    All other components within normal limits  URINALYSIS, ROUTINE W REFLEX MICROSCOPIC - Abnormal; Notable for the following components:   APPearance CLOUDY (*)    Specific Gravity, Urine >1.030 (*)    Bilirubin Urine SMALL (*)    Protein, ur 30 (*)    All other components within normal limits  URINALYSIS, MICROSCOPIC (REFLEX) - Abnormal; Notable for the following components:   Bacteria, UA MANY (*)    All other components within normal limits  ETHANOL  RAPID URINE DRUG SCREEN, HOSP PERFORMED    EKG None  Radiology DG Chest 2 View  Result Date: 05/14/2019 CLINICAL DATA:  Cough and rash.  Patient for psychiatric evaluation. EXAM: CHEST - 2 VIEW COMPARISON:  Single-view of the chest 03/22/2019. PA and lateral chest 11/07/2017. FINDINGS: The lungs are clear. Heart size is normal. There is no pneumothorax or  pleural fluid. No acute or focal bony abnormality. IMPRESSION: Negative chest. Electronically Signed   By: Inge Rise M.D.   On: 05/14/2019 15:32    Procedures Procedures (including critical care time)  Medications Ordered in ED Medications  potassium chloride SA (KLOR-CON) CR tablet 40 mEq (40 mEq Oral Given 05/14/19 1739)    ED Course  I have reviewed the triage vital signs and the nursing notes.  Pertinent labs & imaging results that were available during my care of the patient were reviewed by me and considered in my medical decision making (see chart for details).    MDM Rules/Calculators/A&P                      59 year old male past history of HIV, diabetes who presents for evaluation of suicidal ideation, depression and inability to care for himself.  He has been homeless and missing for the last several months.  He states he has not been taking any of his medications.  He is concerned about a thrush infection.  Also reports that "he does not want a live anymore" because he does not live like this.  On initial ED arrival, sickly appearing.  Vital signs are stable.  He does have evidence of thrush noted on his mouth.  We will plan to check medical clearance labs, consult TTS.  Review of his records show that in February 2019, he had a CD4 count of 280.  C shows leukopenia of 3.4.  Hemoglobin is 12.2.  Ethanol, salicylate, acetaminophen levels unremarkable.  CMP shows potassium of 3.3.  BUN and creatinine within normal limits.  Check x-ray shows no evidence of acute abnormalities.  UDS negative.  UA shows small bili, proteinuria.  Patient is medically cleared.  I discussed with behavioral health.  They evaluated patient and felt like patient was psych cleared.  I discussed with [facility] counselor).  She stated that when she  initially talked to patient, he adamantly denied any SI.  She states that the only reason he is not taking his meds is because he does not have them and  if he had them, he would take them.  She does report a cousin was present in the room did not make any mention of the "about wanting to set himself on fire.  Did feel like patient was able to exhibit decision-making capacity.  I discussed at length with both patient and the cousin.  Because patient has difficulty hearing, utilize writing out my questions on the paper and having him answer.  Cousin did confirm that he had told a cousin that he was going to set himself on fire but does state that sometimes he says these things because he gets in a mindset and then gets out of it and does not want to do them.  Patient states that he does not want to die.  He denies any SI currently.  He states that "sometimes things just get overwhelming with me and I think about these things but then it passes."  He states "I feel like things are looking up now that we have a plan and my family is involved."  He states that he would take his medications if he had them.  He denies any HI, SI currently.  Husband states that the plan is to take him back to a boardinghouse where homeless people are allowed to stay in association with [facility].  I have put in a case manager consult to get him evaluated with a PCP as well as giving him referral to infectious disease clinic.  After discussion with both cousin and patient, patient is alert and oriented x3 and able to answer questions without difficulty.  At this time, he currently does not endorse any suicidal ideations.  Discussed with Dr. Clarene Duke.  At this time, patient seems safe for discharge.  He is agreeable to take his medications.  We will plan to give him nystatin for thrush. Patient had ample opportunity for questions and discussion. All patient's questions were answered with full understanding. Strict return precautions discussed. Patient expresses understanding and agreement to plan.   Portions of this note were generated with Scientist, clinical (histocompatibility and immunogenetics). Dictation errors may occur  despite best attempts at proofreading.   Final Clinical Impression(s) / ED Diagnoses Final diagnoses:  Thrush    Rx / DC Orders ED Discharge Orders         Ordered    elvitegravir-cobicistat-emtricitabine-tenofovir (GENVOYA) 150-150-200-10 MG TABS tablet  Daily,   Status:  Discontinued    Note to Pharmacy: Must call for refills [phone]   05/14/19 2009    nystatin (MYCOSTATIN) 100000 UNIT/ML suspension  4 times daily,   Status:  Discontinued     05/14/19 2009    elvitegravir-cobicistat-emtricitabine-tenofovir (GENVOYA) 150-150-200-10 MG TABS tablet  Daily,   Status:  Discontinued    Note to Pharmacy: Must call for refills [phone]   05/14/19 2149    elvitegravir-cobicistat-emtricitabine-tenofovir (GENVOYA) 150-150-200-10 MG TABS tablet  Daily    Note to Pharmacy: Must call for refills [phone]   05/14/19 2158    nystatin (MYCOSTATIN) 100000 UNIT/ML suspension  4 times daily     05/14/19 2206           Rosana Hoes 05/15/19 0011    Little, Ambrose Finland, MD 05/17/19 [phone]

## 2019-05-14 NOTE — ED Notes (Signed)
Pt having TTS done,  Counselor made aware that pt can read lips and family is in with pt and pt trust that family member to write things down  To help  ptunderstand

## 2019-05-14 NOTE — ED Notes (Signed)
Pt is dressed in purple scrubs , has been wanded by security  per cousin pt has been missing for a while and  He had reached out to a family member and they called him ,

## 2019-05-15 NOTE — Progress Notes (Addendum)
TOC CM referral received to arrange follow up appt with PCP and Infectious Disease. TOC CM contacted pt and reached his cousin Vanetta Mulders, cousin stated pt is hearing impaired. States he got him a hotel room at [facility] and he will be moving into a boarding house in the next couple of days. Cousin will assist pt with getting to his appts and picking up medications.  [facility] appt scheduled for 05/22/2019 at 1030 am and Infectious Disease, Dr Luciana Axe on 05/23/2019 at 10:45 am. Contacted cousin with appt times. Referring ED provider updated. Isidoro Donning RN CCM, [facility] ED TOC CM [phone]

## 2019-05-18 ENCOUNTER — Telehealth: Payer: Self-pay | Admitting: *Deleted

## 2019-05-18 NOTE — Telephone Encounter (Signed)
TOC CM received call with questions about potassium rx due to his potassium being low. Explained to follow up with his PCP, rx for potassuim was not sent from ED visit. Provided information on potassium rich foods. Isidoro Donning RN CCM, [facility] ED TOC CM [phone]

## 2019-05-23 ENCOUNTER — Encounter: Payer: Self-pay | Admitting: Internal Medicine

## 2019-05-23 ENCOUNTER — Other Ambulatory Visit: Payer: Self-pay

## 2019-05-23 ENCOUNTER — Ambulatory Visit (INDEPENDENT_AMBULATORY_CARE_PROVIDER_SITE_OTHER): Payer: Medicare Other | Admitting: Internal Medicine

## 2019-05-23 VITALS — BP 118/84 | HR 80 | Temp 98.6°F | Ht 65.0 in | Wt 112.0 lb

## 2019-05-23 DIAGNOSIS — Z720 Tobacco use: Secondary | ICD-10-CM

## 2019-05-23 DIAGNOSIS — Z113 Encounter for screening for infections with a predominantly sexual mode of transmission: Secondary | ICD-10-CM | POA: Diagnosis present

## 2019-05-23 DIAGNOSIS — R634 Abnormal weight loss: Secondary | ICD-10-CM | POA: Diagnosis not present

## 2019-05-23 DIAGNOSIS — Z794 Long term (current) use of insulin: Secondary | ICD-10-CM

## 2019-05-23 DIAGNOSIS — E119 Type 2 diabetes mellitus without complications: Secondary | ICD-10-CM

## 2019-05-23 DIAGNOSIS — B20 Human immunodeficiency virus [HIV] disease: Secondary | ICD-10-CM | POA: Diagnosis not present

## 2019-05-23 DIAGNOSIS — Z79899 Other long term (current) drug therapy: Secondary | ICD-10-CM

## 2019-05-23 DIAGNOSIS — I10 Essential (primary) hypertension: Secondary | ICD-10-CM | POA: Diagnosis not present

## 2019-05-23 MED ORDER — INSULIN GLARGINE 100 UNIT/ML SOLOSTAR PEN: 10.0000 [IU] | PEN_INJECTOR | Freq: Every day | SUBCUTANEOUS | 11 refills | Status: AC

## 2019-05-23 MED ORDER — AMITRIPTYLINE HCL 25 MG PO TABS: 25.0000 mg | ORAL_TABLET | Freq: Every evening | ORAL | 0 refills | Status: DC | PRN

## 2019-05-23 MED ORDER — GENVOYA 150-150-200-10 MG PO TABS: 1.0000 | ORAL_TABLET | Freq: Every day | ORAL | 5 refills | Status: AC

## 2019-05-23 MED ORDER — MEGESTROL ACETATE 625 MG/5ML PO SUSP: 625.0000 mg | Freq: Every day | ORAL | 3 refills | Status: AC

## 2019-05-23 NOTE — Progress Notes (Signed)
   Subjective:    Patient ID: Devin Rivera, adult    DOB: [birth year], 59 y.o.   MRN: [id number]  HPI Here for follow up of HIV She has been out of care for over 2 years and previously was on Genvoya.  Has been homeless and in different cities.  Recently in the ED with concerns for thrush.  Had been missing since November of 2020 per the cousin.  Had been on medication but ran out at some point he is unable to tell.  Says he is on it now.  Out of his BP meds and insulin.  Only uses Lantus once a day.  Was not able to be seen by the PCP yesterday due to insurance issues.  Has established with [facility] yesterday and helping with housing.   Last CD4 2 years ago was 280.     Review of Systems  Constitutional: Positive for unexpected weight change.       About 40 lbs, poor appetite  Gastrointestinal: Negative for diarrhea and nausea.  Skin: Negative for rash.  Psychiatric/Behavioral: Negative for sleep disturbance.       Objective:   Physical Exam Constitutional:      Appearance: Normal appearance.  Eyes:     General: No scleral icterus. Cardiovascular:     Rate and Rhythm: Normal rate and regular rhythm.  Skin:    Findings: No rash.  Neurological:     General: No focal deficit present.     Mental Status: She is alert.   SH: + tobacco        Assessment & Plan:

## 2019-05-23 NOTE — Assessment & Plan Note (Signed)
Will screen 

## 2019-05-23 NOTE — Assessment & Plan Note (Signed)
He has been off of his BP meds and his BP is ok now.  I will have him hold off on restarting until he gets into a PCP

## 2019-05-23 NOTE — Assessment & Plan Note (Signed)
No diarrhea but poor intake.  I will try megace for appetite

## 2019-05-23 NOTE — Assessment & Plan Note (Signed)
I discussed the importance of cessation.  He is precontemplative

## 2019-05-23 NOTE — Assessment & Plan Note (Signed)
I will check his labs today to see where he is at as it is hard to tell how long he has been on the medication recently.   rtc 4 weeks

## 2019-05-23 NOTE — Assessment & Plan Note (Signed)
I refilled his lantus and he will get in to see a PCP

## 2019-05-24 ENCOUNTER — Telehealth: Payer: Self-pay

## 2019-05-24 ENCOUNTER — Other Ambulatory Visit: Payer: Self-pay | Admitting: Internal Medicine

## 2019-05-24 LAB — T-HELPER CELL (CD4) - (RCID CLINIC ONLY)
CD4 % Helper T Cell: 2 % — ABNORMAL LOW (ref 33–65)
CD4 T Cell Abs: <35 /uL — ABNORMAL LOW (ref 400–1790)

## 2019-05-24 MED ORDER — SULFAMETHOXAZOLE-TRIMETHOPRIM 400-80 MG PO TABS: 1.0000 | ORAL_TABLET | Freq: Two times a day (BID) | ORAL | 2 refills | Status: AC

## 2019-05-24 NOTE — Telephone Encounter (Signed)
Spoke with patient's cousin and informed him that patient's CD4 count was low and Dr. Luciana Axe started her on prophylactic antibiotics. Educated him that this is preventative so she doesn't get sick since her immune system is low. Reiterated the importance of maintaining ARV therapy to help with improving that. He appreciated the call and would call back with any questions or concerns.   Devin Konz Loyola Mast, RN

## 2019-05-24 NOTE — Telephone Encounter (Signed)
-----   Message from Gardiner Barefoot, MD sent at 05/24/2019  1:56 PM EST ----- Please let the patients cousin know that his immune system is down and he should start bactrim one pill daily.  It has been sent in. thanks

## 2019-05-25 LAB — CBC WITH DIFFERENTIAL/PLATELET
Absolute Monocytes: 396 cells/uL (ref 200–950)
Basophils Absolute: 21 cells/uL (ref 0–200)
Basophils Relative: 0.7 %
Eosinophils Absolute: 9 cells/uL — ABNORMAL LOW (ref 15–500)
Eosinophils Relative: 0.3 %
HCT: 33.9 % — ABNORMAL LOW (ref 38.5–50.0)
Hemoglobin: 11.6 g/dL — ABNORMAL LOW (ref 13.2–17.1)
Lymphs Abs: 1323 cells/uL (ref 850–3900)
MCH: 30.1 pg (ref 27.0–33.0)
MCHC: 34.2 g/dL (ref 32.0–36.0)
MCV: 87.8 fL (ref 80.0–100.0)
MPV: 9.7 fL (ref 7.5–12.5)
Monocytes Relative: 13.2 %
Neutro Abs: 1251 cells/uL — ABNORMAL LOW (ref 1500–7800)
Neutrophils Relative %: 41.7 %
Platelets: 338 10*3/uL (ref 140–400)
RBC: 3.86 10*6/uL — ABNORMAL LOW (ref 4.20–5.80)
RDW: 13.5 % (ref 11.0–15.0)
Total Lymphocyte: 44.1 %
WBC: 3 10*3/uL — ABNORMAL LOW (ref 3.8–10.8)

## 2019-05-25 LAB — LIPID PANEL
Cholesterol: 196 mg/dL (ref <200)
HDL: 27 mg/dL — ABNORMAL LOW (ref >=40)
LDL Cholesterol (Calc): 146 mg/dL (calc) — ABNORMAL HIGH
Non-HDL Cholesterol (Calc): 169 mg/dL (calc) — ABNORMAL HIGH (ref <130)
Total CHOL/HDL Ratio: 7.3 (calc) — ABNORMAL HIGH (ref <5.0)
Triglycerides: 112 mg/dL (ref <150)

## 2019-05-25 LAB — COMPLETE METABOLIC PANEL WITH GFR
AG Ratio: 1.4 (calc) (ref 1.0–2.5)
ALT: 9 U/L (ref 9–46)
AST: 14 U/L (ref 10–35)
Albumin: 3.5 g/dL — ABNORMAL LOW (ref 3.6–5.1)
Alkaline phosphatase (APISO): 74 U/L (ref 35–144)
BUN/Creatinine Ratio: 11 (calc) (ref 6–22)
BUN: 7 mg/dL (ref 7–25)
CO2: 31 mmol/L (ref 20–32)
Calcium: 9.3 mg/dL (ref 8.6–10.3)
Chloride: 104 mmol/L (ref 98–110)
Creat: 0.65 mg/dL — ABNORMAL LOW (ref 0.70–1.33)
GFR, Est African American: 124 mL/min/{1.73_m2} (ref >=60)
GFR, Est Non African American: 107 mL/min/{1.73_m2} (ref >=60)
Globulin: 2.5 g/dL (calc) (ref 1.9–3.7)
Glucose, Bld: 177 mg/dL — ABNORMAL HIGH (ref 65–99)
Potassium: 3.8 mmol/L (ref 3.5–5.3)
Sodium: 142 mmol/L (ref 135–146)
Total Bilirubin: 0.4 mg/dL (ref 0.2–1.2)
Total Protein: 6 g/dL — ABNORMAL LOW (ref 6.1–8.1)

## 2019-05-25 LAB — RPR: RPR Ser Ql: NONREACTIVE

## 2019-05-25 LAB — HIV-1 RNA QUANT-NO REFLEX-BLD
HIV 1 RNA Quant: 174000 copies/mL — ABNORMAL HIGH
HIV-1 RNA Quant, Log: 5.24 Log copies/mL — ABNORMAL HIGH

## 2019-06-17 ENCOUNTER — Other Ambulatory Visit: Payer: Self-pay | Admitting: Internal Medicine

## 2019-06-19 ENCOUNTER — Encounter: Payer: Self-pay | Admitting: Internal Medicine

## 2019-06-19 ENCOUNTER — Ambulatory Visit (INDEPENDENT_AMBULATORY_CARE_PROVIDER_SITE_OTHER): Payer: Medicare Other | Admitting: Internal Medicine

## 2019-06-19 ENCOUNTER — Other Ambulatory Visit: Payer: Self-pay

## 2019-06-19 VITALS — BP 127/85 | HR 73 | Temp 98.2°F | Wt 111.0 lb

## 2019-06-19 DIAGNOSIS — B20 Human immunodeficiency virus [HIV] disease: Secondary | ICD-10-CM

## 2019-06-19 DIAGNOSIS — Z590 Homelessness: Secondary | ICD-10-CM | POA: Diagnosis not present

## 2019-06-19 DIAGNOSIS — Z720 Tobacco use: Secondary | ICD-10-CM | POA: Diagnosis not present

## 2019-06-19 DIAGNOSIS — Z5901 Sheltered homelessness: Secondary | ICD-10-CM

## 2019-06-20 ENCOUNTER — Ambulatory Visit: Payer: Medicare Other | Admitting: Internal Medicine

## 2019-06-20 ENCOUNTER — Ambulatory Visit: Payer: Medicare Other | Admitting: Family Medicine

## 2019-06-20 ENCOUNTER — Encounter: Payer: Self-pay | Admitting: Internal Medicine

## 2019-06-20 ENCOUNTER — Telehealth: Payer: Self-pay | Admitting: *Deleted

## 2019-06-20 DIAGNOSIS — Z590 Homelessness: Secondary | ICD-10-CM | POA: Insufficient documentation

## 2019-06-20 DIAGNOSIS — Z5901 Sheltered homelessness: Secondary | ICD-10-CM | POA: Insufficient documentation

## 2019-06-20 LAB — T-HELPER CELL (CD4) - (RCID CLINIC ONLY)
CD4 % Helper T Cell: 2 % — ABNORMAL LOW (ref 33–65)
CD4 T Cell Abs: <35 /uL — ABNORMAL LOW (ref 400–1790)

## 2019-06-20 NOTE — Assessment & Plan Note (Addendum)
Will reassess labs today on medication to see if he is doing well.  So far, the CD4 has returned at the time of this note and remains < 35 so I am concerned with resistance and will check a genotype.  He will rtc in 2 months, sooner if resistance noted.   He refuses all vaccines.

## 2019-06-20 NOTE — Progress Notes (Signed)
   Subjective:    Patient ID: Devin Rivera, adult    DOB: [birth year], 59 y.o.   MRN: [id number]  HPI Here for follow up of HIV He continues on Genvoya and endorses good compliance.  He has not yet been able to establish with a PCP.  He recently fell and hurt his right shoulder and having continued pain with this.  His last CD 4 was < 35 and viral load 174,000.  He says he is taking his medications daily.  Here with his caregiver/cousin.  Got his hearing aids.    Review of Systems  Constitutional: Negative for unexpected weight change.  Gastrointestinal: Negative for diarrhea and nausea.  Skin: Negative for rash.       Objective:   Physical Exam Eyes:     General: No scleral icterus. Cardiovascular:     Rate and Rhythm: Normal rate.  Pulmonary:     Effort: Pulmonary effort is normal.  Neurological:     General: No focal deficit present.     Mental Status: She is alert.  Psychiatric:        Mood and Affect: Mood normal.   SH: + tobacco        Assessment & Plan:

## 2019-06-20 NOTE — Assessment & Plan Note (Signed)
He is working with [facility] to help with housing issues.

## 2019-06-20 NOTE — Telephone Encounter (Addendum)
Relayed to lab. Andree Coss, RN   ----- Message from Gardiner Barefoot, MD sent at 06/20/2019  9:32 AM EDT ----- Can you add a genotype to his labs from yesterday.  Order placed.  thanks

## 2019-06-20 NOTE — Assessment & Plan Note (Signed)
Discussed cessation 

## 2019-06-21 ENCOUNTER — Ambulatory Visit: Payer: Medicare Other | Admitting: Internal Medicine

## 2019-06-21 LAB — COMPLETE METABOLIC PANEL WITH GFR
AG Ratio: 1.4 (calc) (ref 1.0–2.5)
ALT: 9 U/L (ref 9–46)
AST: 16 U/L (ref 10–35)
Albumin: 3.4 g/dL — ABNORMAL LOW (ref 3.6–5.1)
Alkaline phosphatase (APISO): 73 U/L (ref 35–144)
BUN/Creatinine Ratio: 13 (calc) (ref 6–22)
BUN: 8 mg/dL (ref 7–25)
CO2: 32 mmol/L (ref 20–32)
Calcium: 8.9 mg/dL (ref 8.6–10.3)
Chloride: 104 mmol/L (ref 98–110)
Creat: 0.62 mg/dL — ABNORMAL LOW (ref 0.70–1.33)
GFR, Est African American: 127 mL/min/{1.73_m2} (ref >=60)
GFR, Est Non African American: 109 mL/min/{1.73_m2} (ref >=60)
Globulin: 2.4 g/dL (calc) (ref 1.9–3.7)
Glucose, Bld: 245 mg/dL — ABNORMAL HIGH (ref 65–99)
Potassium: 3.3 mmol/L — ABNORMAL LOW (ref 3.5–5.3)
Sodium: 142 mmol/L (ref 135–146)
Total Bilirubin: 0.4 mg/dL (ref 0.2–1.2)
Total Protein: 5.8 g/dL — ABNORMAL LOW (ref 6.1–8.1)

## 2019-06-21 LAB — HIV-1 RNA QUANT-NO REFLEX-BLD
HIV 1 RNA Quant: 63400 copies/mL — ABNORMAL HIGH
HIV-1 RNA Quant, Log: 4.8 Log copies/mL — ABNORMAL HIGH

## 2019-06-22 NOTE — Addendum Note (Signed)
Addended by: Mariea Clonts D on: 06/22/2019 12:13 PM   Modules accepted: Orders

## 2019-06-30 LAB — HIV-1 GENOTYPING (RTI,PI,IN INHBTR): HIV-1 Genotype: DETECTED — AB

## 2019-07-03 ENCOUNTER — Other Ambulatory Visit: Payer: Self-pay

## 2019-07-03 ENCOUNTER — Encounter (HOSPITAL_COMMUNITY): Payer: Self-pay | Admitting: Emergency Medicine

## 2019-07-03 ENCOUNTER — Ambulatory Visit: Payer: Medicare Other | Admitting: Family Medicine

## 2019-07-03 ENCOUNTER — Emergency Department (HOSPITAL_COMMUNITY)
Admission: EM | Admit: 2019-07-03 | Discharge: 2019-07-04 | Disposition: A | Discharge status: Home / Self Care | Payer: Medicare Other | Attending: Emergency Medicine | Admitting: Emergency Medicine

## 2019-07-03 DIAGNOSIS — Z5321 Procedure and treatment not carried out due to patient leaving prior to being seen by health care provider: Secondary | ICD-10-CM | POA: Diagnosis not present

## 2019-07-03 DIAGNOSIS — R079 Chest pain, unspecified: Secondary | ICD-10-CM | POA: Diagnosis not present

## 2019-07-03 LAB — CBC WITH DIFFERENTIAL/PLATELET
Abs Immature Granulocytes: 0.06 10*3/uL (ref 0.00–0.07)
Basophils Absolute: 0 10*3/uL (ref 0.0–0.1)
Basophils Relative: 1 %
Eosinophils Absolute: 0 10*3/uL (ref 0.0–0.5)
Eosinophils Relative: 0 %
HCT: 35 % — ABNORMAL LOW (ref 39.0–52.0)
Hemoglobin: 11.2 g/dL — ABNORMAL LOW (ref 13.0–17.0)
Immature Granulocytes: 1 %
Lymphocytes Relative: 33 %
Lymphs Abs: 1.4 10*3/uL (ref 0.7–4.0)
MCH: 30.3 pg (ref 26.0–34.0)
MCHC: 32 g/dL (ref 30.0–36.0)
MCV: 94.6 fL (ref 80.0–100.0)
Monocytes Absolute: 0.4 10*3/uL (ref 0.1–1.0)
Monocytes Relative: 10 %
Neutro Abs: 2.3 10*3/uL (ref 1.7–7.7)
Neutrophils Relative %: 55 %
Platelets: 357 10*3/uL (ref 150–400)
RBC: 3.7 MIL/uL — ABNORMAL LOW (ref 4.22–5.81)
RDW: 14 % (ref 11.5–15.5)
WBC: 4.2 10*3/uL (ref 4.0–10.5)
nRBC: 0 % (ref 0.0–0.2)

## 2019-07-03 NOTE — ED Notes (Signed)
Pt states that he is hearing impair. No prior hx documented on the chart.

## 2019-07-03 NOTE — ED Notes (Signed)
Called pt for triage, no answer x 1.

## 2019-07-03 NOTE — ED Triage Notes (Signed)
Pt refusing to talk on triage, just said cp and SOB. Unable to get more information from pt.

## 2019-07-04 ENCOUNTER — Emergency Department (HOSPITAL_COMMUNITY)
Admission: EM | Admit: 2019-07-04 | Discharge: 2019-07-05 | Discharge status: Against Medical Advice | Payer: Medicare Other | Source: Home / Self Care

## 2019-07-04 ENCOUNTER — Other Ambulatory Visit: Payer: Self-pay

## 2019-07-04 DIAGNOSIS — R079 Chest pain, unspecified: Secondary | ICD-10-CM | POA: Diagnosis not present

## 2019-07-04 LAB — BASIC METABOLIC PANEL
Anion gap: 9 (ref 5–15)
BUN: 7 mg/dL (ref 6–20)
CO2: 28 mmol/L (ref 22–32)
Calcium: 9.1 mg/dL (ref 8.9–10.3)
Chloride: 104 mmol/L (ref 98–111)
Creatinine, Ser: 0.7 mg/dL (ref 0.61–1.24)
GFR calc Af Amer: >60 mL/min (ref >60)
GFR calc non Af Amer: >60 mL/min (ref >60)
Glucose, Bld: 149 mg/dL — ABNORMAL HIGH (ref 70–99)
Potassium: 2.8 mmol/L — ABNORMAL LOW (ref 3.5–5.1)
Sodium: 141 mmol/L (ref 135–145)

## 2019-07-04 LAB — TROPONIN I (HIGH SENSITIVITY): Troponin I (High Sensitivity): 6 ng/L (ref <18)

## 2019-07-04 NOTE — ED Triage Notes (Signed)
Pt returns for chest pain and SOB, unable to get much info from pt, states he is hearing impaired but when talked to louder pt just stares.

## 2019-07-04 NOTE — ED Notes (Signed)
Pt called for recheck vitals, no answer 

## 2019-07-04 NOTE — ED Notes (Signed)
Pt called for vitals, no response x1 

## 2019-07-05 ENCOUNTER — Encounter (HOSPITAL_COMMUNITY): Payer: Self-pay | Admitting: Emergency Medicine

## 2019-07-05 ENCOUNTER — Emergency Department (HOSPITAL_COMMUNITY)
Admission: EM | Admit: 2019-07-05 | Discharge: 2019-07-05 | Disposition: A | Discharge status: Home / Self Care | Payer: Medicare Other | Attending: Emergency Medicine | Admitting: Emergency Medicine

## 2019-07-05 ENCOUNTER — Other Ambulatory Visit: Payer: Self-pay

## 2019-07-05 DIAGNOSIS — I10 Essential (primary) hypertension: Secondary | ICD-10-CM | POA: Diagnosis not present

## 2019-07-05 DIAGNOSIS — Z5900 Homelessness unspecified: Secondary | ICD-10-CM

## 2019-07-05 DIAGNOSIS — Z79899 Other long term (current) drug therapy: Secondary | ICD-10-CM | POA: Diagnosis not present

## 2019-07-05 DIAGNOSIS — B20 Human immunodeficiency virus [HIV] disease: Secondary | ICD-10-CM | POA: Insufficient documentation

## 2019-07-05 DIAGNOSIS — Z794 Long term (current) use of insulin: Secondary | ICD-10-CM | POA: Diagnosis not present

## 2019-07-05 DIAGNOSIS — E119 Type 2 diabetes mellitus without complications: Secondary | ICD-10-CM | POA: Diagnosis not present

## 2019-07-05 DIAGNOSIS — F17210 Nicotine dependence, cigarettes, uncomplicated: Secondary | ICD-10-CM | POA: Insufficient documentation

## 2019-07-05 NOTE — Discharge Instructions (Signed)
Please follow-up with PCP for further evaluation.  Return to the ER for new or worsening symptoms.

## 2019-07-05 NOTE — ED Triage Notes (Signed)
Pt to ED via [facility]   EMS reports pt did not tell them what was wrong but did state that the shelters are full.   Pt is homeless.  Pt was also seen here yesterday

## 2019-07-05 NOTE — ED Notes (Signed)
Upon this pts assessment, this pt was sleeping. Upon waking, this pt had no complaints other than that he was cold. Unremarkable physical assessment from this RN. Pt appears stable and is sleeping intermittently.

## 2019-07-05 NOTE — ED Notes (Signed)
Pt called for vitals, no response x3 

## 2019-07-05 NOTE — ED Provider Notes (Signed)
[facility] EMERGENCY DEPARTMENT Provider Note   CSN: [id number] Arrival date & time: 07/05/19  1813     History Chief Complaint  Patient presents with  . Other    Devin [facility] is a 59 y.o. male with a past medical history significant for diabetes, HIV, and hypertension who presents to the ED via EMS due to unknown reason.  Patient told EMS that the shelters were full tonight.  Patient is currently homeless.  Upon initial exam, patient will not answer any questions.  He does not appear to be in any pain.  He is resting comfortably in bed with food at bedside.  Level 5 caveat secondary to patient being nonverbal.    Past Medical History:  Diagnosis Date  . Diabetes mellitus without complication ([place])   . HIV (human immunodeficiency virus infection) ([place])   . Hypertension     Patient Active Problem List   Diagnosis Date Noted  . Lives in homeless shelter 06/20/2019  . Weight loss, non-intentional 05/23/2019  . Type 2 diabetes mellitus without complications ([place]) 76/28/3151  . Screening examination for venereal disease 04/28/2017  . Encounter for long-term (current) use of high-risk medication 04/28/2017  . HTN (hypertension) 07/29/2016  . Tobacco abuse 07/29/2016  . HIV infection, symptomatic ([place]) 11/27/2015  . Erectile dysfunction 11/27/2015    History reviewed. No pertinent surgical history.     No family history on file.  Social History   Tobacco Use  . Smoking status: Current Every Day Smoker    Packs/day: 0.50    Start date: 03/15/1985  . Smokeless tobacco: Never Used  Substance Use Topics  . Alcohol use: Yes    Comment: occasional  . Drug use: Yes    Types: Marijuana    Comment: occasional    Home Medications Prior to Admission medications   Medication Sig Start Date End Date Taking? Authorizing Provider  amitriptyline (ELAVIL) 25 MG tablet TAKE 1 TABLET(25 MG) BY MOUTH AT BEDTIME AS NEEDED FOR SLEEP 06/18/19   Comer, Okey Regal,  MD  benzonatate (TESSALON) 100 MG capsule Take by mouth 3 (three) times daily as needed for cough.    [provider]  elvitegravir-cobicistat-emtricitabine-tenofovir (GENVOYA) 150-150-200-10 MG TABS tablet Take 1 tablet by mouth daily. 05/23/19   Thayer Headings, MD  enalapril (VASOTEC) 20 MG tablet Take 1 tablet (20 mg total) by mouth daily. 07/29/16   Comer, Okey Regal, MD  hydrochlorothiazide (HYDRODIURIL) 25 MG tablet Take 25 mg by mouth daily. Unsure of dose    [provider]  insulin glargine (LANTUS) 100 UNIT/ML Solostar Pen Inject 10 Units into the skin daily. 05/23/19   Comer, Okey Regal, MD  insulin NPH-regular Human (NOVOLIN 70/30) (70-30) 100 UNIT/ML injection Inject into the skin.    [provider]  megestrol (MEGACE ES) 625 MG/5ML suspension Take 5 mLs (625 mg total) by mouth daily. 05/23/19   Comer, Okey Regal, MD  nystatin (MYCOSTATIN) 100000 UNIT/ML suspension Take 5 mLs (500,000 Units total) by mouth 4 (four) times daily. 05/14/19   Volanda Napoleon, PA-C  simethicone (GAS-X) 80 MG chewable tablet Chew 1 tablet (80 mg total) by mouth every 6 (six) hours as needed (abd pain). 11/21/15   Orpah Greek, MD  sulfamethoxazole-trimethoprim (BACTRIM) 400-80 MG tablet Take 1 tablet by mouth 2 (two) times daily. 05/24/19   Comer, Okey Regal, MD  traMADol (ULTRAM) 50 MG tablet Take 1 tablet (50 mg total) by mouth every 6 (six) hours as needed  for moderate pain. 11/21/15   Gilda Crease, MD    Allergies    Glipizide and Metformin and related  Review of Systems   Review of Systems  Unable to perform ROS: Patient nonverbal    Physical Exam Updated Vital Signs BP 127/85   Pulse 89   Temp 99.9 F (37.7 C) (Oral)   Resp 18   Ht 5\' 5"  (1.651 m)   Wt 49.9 kg   SpO2 98%   BMI 18.30 kg/m   Physical Exam Vitals and nursing note reviewed.  Constitutional:      General: He is not in acute distress.    Appearance: He is not ill-appearing.     Comments:  Patient resting comfortably in bed.  Patient will not respond to questions however, his eyes are open and responding to external stimuli.  HENT:     Head: Normocephalic.  Eyes:     Pupils: Pupils are equal, round, and reactive to light.  Cardiovascular:     Rate and Rhythm: Normal rate and regular rhythm.     Pulses: Normal pulses.     Heart sounds: Normal heart sounds. No murmur. No friction rub. No gallop.   Pulmonary:     Effort: Pulmonary effort is normal.     Breath sounds: Normal breath sounds.  Abdominal:     General: Abdomen is flat. There is no distension.     Palpations: Abdomen is soft.     Tenderness: There is no abdominal tenderness. There is no guarding or rebound.  Musculoskeletal:     Cervical back: Neck supple.     Comments: Able to move all 4 extremities without difficulty.  Skin:    General: Skin is warm and dry.  Neurological:     General: No focal deficit present.     Mental Status: He is alert.     ED Results / Procedures / Treatments   Labs (all labs ordered are listed, but only abnormal results are displayed) Labs Reviewed - No data to display  EKG None  Radiology No results found.  Procedures Procedures (including critical care time)  Medications Ordered in ED Medications - No data to display  ED Course  I have reviewed the triage vital signs and the nursing notes.  Pertinent labs & imaging results that were available during my care of the patient were reviewed by me and considered in my medical decision making (see chart for details).    MDM Rules/Calculators/A&P                     59 year old male presents to the ED due to unknown reasons.  He told EMS that the shelters were full tonight.  Patient is currently homeless.  Patient had no complaints during my initial evaluation.  Vitals all within normal limits.  Patient in no acute distress and non-ill-appearing.  Patient resting comfortably in bed physical exam unremarkable.  Patient  appears stable for discharge.  Low suspicion for any emergent conditions.  Advised patient to follow-up with PCP for further evaluation. Strict ED precautions discussed with patient. Patient states understanding and agrees to plan. Patient discharged home in no acute distress and stable vitals. Final Clinical Impression(s) / ED Diagnoses Final diagnoses:  Homelessness    Rx / DC Orders ED Discharge Orders    None       41 07/05/19 2146    2147, MD 07/06/19 [phone]

## 2019-07-05 NOTE — ED Notes (Signed)
Pt escorted out of department and off [employer] by security

## 2019-07-05 NOTE — ED Notes (Addendum)
Upon entering the examination room to discharge this pt, this pt threw the discharge paperwork on the floor. This RN asked this pt why he threw the paperwork and this pt responded "I'll get it". Upon asking this pt if he needed anything or had any questions regarding his paperwork he stated "no" and gave this RN a "thumbs-up". This RN asked this pt if he required a wheelchair to exit the facility and this pt stated "no" and "waved" this RN goodbye. This RN will evaluate in 10 minutes whether or not this pt left the examination room/department.

## 2019-07-05 NOTE — ED Notes (Signed)
Security at Bedside to escort this pt off of [employer]

## 2019-07-05 NOTE — ED Notes (Signed)
This RN entered the room and asked this pt why he has not left the examination room or department. This pt did not respond with any statement other than nodding his head. This RN asked this pt if he required a wheelchair to leave or if security needed to be notified to escort this pt out. This RN asked this pt to leave again and this pt nodded his head in agreement. This RN will reassess this pts presence or lack thereof in 5-10 minutes.

## 2019-07-09 ENCOUNTER — Emergency Department (HOSPITAL_BASED_OUTPATIENT_CLINIC_OR_DEPARTMENT_OTHER): Payer: Medicare Other

## 2019-07-09 ENCOUNTER — Encounter (HOSPITAL_BASED_OUTPATIENT_CLINIC_OR_DEPARTMENT_OTHER): Payer: Self-pay

## 2019-07-09 ENCOUNTER — Other Ambulatory Visit: Payer: Self-pay

## 2019-07-09 ENCOUNTER — Ambulatory Visit: Payer: Medicare Other | Admitting: Family Medicine

## 2019-07-09 ENCOUNTER — Emergency Department (HOSPITAL_BASED_OUTPATIENT_CLINIC_OR_DEPARTMENT_OTHER)
Admission: EM | Admit: 2019-07-09 | Discharge: 2019-07-09 | Disposition: A | Discharge status: Home / Self Care | Payer: Medicare Other | Attending: Emergency Medicine | Admitting: Emergency Medicine

## 2019-07-09 DIAGNOSIS — F17210 Nicotine dependence, cigarettes, uncomplicated: Secondary | ICD-10-CM | POA: Diagnosis not present

## 2019-07-09 DIAGNOSIS — S300XXA Contusion of lower back and pelvis, initial encounter: Secondary | ICD-10-CM | POA: Insufficient documentation

## 2019-07-09 DIAGNOSIS — Y9301 Activity, walking, marching and hiking: Secondary | ICD-10-CM | POA: Diagnosis not present

## 2019-07-09 DIAGNOSIS — Y999 Unspecified external cause status: Secondary | ICD-10-CM | POA: Insufficient documentation

## 2019-07-09 DIAGNOSIS — W19XXXA Unspecified fall, initial encounter: Secondary | ICD-10-CM

## 2019-07-09 DIAGNOSIS — E119 Type 2 diabetes mellitus without complications: Secondary | ICD-10-CM | POA: Insufficient documentation

## 2019-07-09 DIAGNOSIS — Y929 Unspecified place or not applicable: Secondary | ICD-10-CM | POA: Diagnosis not present

## 2019-07-09 DIAGNOSIS — B20 Human immunodeficiency virus [HIV] disease: Secondary | ICD-10-CM | POA: Diagnosis not present

## 2019-07-09 DIAGNOSIS — W010XXA Fall on same level from slipping, tripping and stumbling without subsequent striking against object, initial encounter: Secondary | ICD-10-CM | POA: Diagnosis not present

## 2019-07-09 DIAGNOSIS — I10 Essential (primary) hypertension: Secondary | ICD-10-CM | POA: Diagnosis not present

## 2019-07-09 DIAGNOSIS — S40011A Contusion of right shoulder, initial encounter: Secondary | ICD-10-CM | POA: Insufficient documentation

## 2019-07-09 DIAGNOSIS — T148XXA Other injury of unspecified body region, initial encounter: Secondary | ICD-10-CM

## 2019-07-09 MED ORDER — NAPROXEN 250 MG PO TABS
500.0000 mg | ORAL_TABLET | Freq: Once | ORAL | Status: AC
  Administered 2019-07-09: 500 mg via ORAL
  Filled 2019-07-09: qty 2

## 2019-07-09 NOTE — ED Triage Notes (Signed)
Pt arrives via [facility] after a fall at the hotel he was staying at. Pt c/o pain to left flank. EMS reports pt was ambulatory on scene, yelling at hotel staff. In triage pt c/o right shoulder pain.   VS with EMS 122/80 110 HR 18 98% RA 190 CBG

## 2019-07-09 NOTE — Discharge Instructions (Addendum)
You were evaluated in the Emergency Department and after careful evaluation, we did not find any emergent condition requiring admission or further testing in the hospital.  Your exam/testing today was overall reassuring.  Your x-rays did not show any broken bones.  We recommend rest, Tylenol, Motrin.  Please return to the Emergency Department if you experience any worsening of your condition.  We encourage you to follow up with a primary care provider.  Thank you for allowing [place] to be a part of your care.

## 2019-07-09 NOTE — ED Provider Notes (Signed)
[facility] Provider Note MRN:  [id number]  Arrival date & time: 07/09/19     Chief Complaint   Fall   History of Present Illness   Devin Rivera is a 59 y.o. year-old male with a history of hypertension, HIV, diabetes presenting to the ED with chief complaint of fall.  Ground-level fall, tripped, slipped on a wet surface.  Pain to the middle of the lumbar back, pain to the right shoulder.  No head trauma, no loss of consciousness, no numbness weakness, no bowel or bladder dysfunction.  No chest pain or shortness of breath, no abdominal pain.  Pain is constant, mild to moderate, no exacerbating or alleviating factors.  Review of Systems  A complete 10 system review of systems was obtained and all systems are negative except as noted in the HPI and PMH.   Patient's Health History    Past Medical History:  Diagnosis Date  . Diabetes mellitus without complication ([place])   . HIV (human immunodeficiency virus infection) ([place])   . Hypertension     History reviewed. No pertinent surgical history.  No family history on file.  Social History   Socioeconomic History  . Marital status: Single    Spouse name: Not on file  . Number of children: Not on file  . Years of education: Not on file  . Highest education level: Not on file  Occupational History  . Not on file  Tobacco Use  . Smoking status: Current Every Day Smoker    Packs/day: 0.50    Start date: 03/15/1985  . Smokeless tobacco: Never Used  Substance and Sexual Activity  . Alcohol use: Yes    Comment: occasional  . Drug use: Yes    Types: Marijuana    Comment: occasional  . Sexual activity: Not on file    Comment: patient given condoms  Other Topics Concern  . Not on file  Social History Narrative  . Not on file   Social Determinants of Health   Financial Resource Strain:   . Difficulty of Paying Living Expenses:   Food Insecurity:   . Worried About  Charity fundraiser in the Last Year:   . Arboriculturist in the Last Year:   Transportation Needs:   . Film/video editor (Medical):   Marland Kitchen Lack of Transportation (Non-Medical):   Physical Activity:   . Days of Exercise per Week:   . Minutes of Exercise per Session:   Stress:   . Feeling of Stress :   Social Connections:   . Frequency of Communication with Friends and Family:   . Frequency of Social Gatherings with Friends and Family:   . Attends Religious Services:   . Active Member of Clubs or Organizations:   . Attends Archivist Meetings:   Marland Kitchen Marital Status:   Intimate Partner Violence:   . Fear of Current or Ex-Partner:   . Emotionally Abused:   Marland Kitchen Physically Abused:   . Sexually Abused:      Physical Exam   Vitals:   07/09/19 1207  BP: 104/64  Pulse: 92  Resp: 18  Temp: 98.6 F (37 C)  SpO2: 98%    CONSTITUTIONAL: Well-appearing, NAD NEURO:  Alert and oriented x 3, no focal deficits EYES:  eyes equal and reactive ENT/NECK:  no LAD, no JVD CARDIO: Regular rate, well-perfused, normal S1 and S2 PULM:  CTAB no wheezing or rhonchi GI/GU:  normal bowel sounds, non-distended, non-tender  MSK/SPINE:  No gross deformities, no edema, mild tenderness palpation to the midline lumbar back, tenderness palpation to the right shoulder SKIN:  no rash, atraumatic PSYCH:  Appropriate speech and behavior  *Additional and/or pertinent findings included in MDM below  Diagnostic and Interventional Summary    EKG Interpretation  Date/Time:    Ventricular Rate:    PR Interval:    QRS Duration:   QT Interval:    QTC Calculation:   R Axis:     Text Interpretation:        Labs Reviewed - No data to display  DG Shoulder Right  Final Result    DG Lumbar Spine Complete  Final Result      Medications  naproxen (NAPROSYN) tablet 500 mg (has no administration in time range)     Procedures  /  Critical Care Procedures  ED Course and Medical Decision Making    I have reviewed the triage vital signs, the nursing notes, and pertinent available records from the EMR.  Listed above are laboratory and imaging tests that I personally ordered, reviewed, and interpreted and then considered in my medical decision making (see below for details).      X-rays to exclude fracture, no signs of myelopathy, anticipating discharge.  X-rays are negative, appropriate for discharge.  Devin Sow. Pilar Plate, MD [facility] [email or web address]  Final Clinical Impressions(s) / ED Diagnoses     ICD-10-CM   1. Fall, initial encounter  W19.XXXA   2. Bruising  [email or web address]     ED Discharge Orders    None       Discharge Instructions Discussed with and Provided to Patient:     Discharge Instructions     You were evaluated in the Emergency Department and after careful evaluation, we did not find any emergent condition requiring admission or further testing in the [facility].  Your exam/testing today was overall reassuring.  Your x-rays did not show any broken bones.  We recommend rest, Tylenol, Motrin.  Please return to the Emergency Department if you experience any worsening of your condition.  We encourage you to follow up with a primary care provider.  Thank you for allowing [place] to be a part of your care.        Sabas Sous, MD 07/09/19 1318

## 2019-07-24 ENCOUNTER — Other Ambulatory Visit: Payer: Self-pay | Admitting: Internal Medicine

## 2019-07-28 ENCOUNTER — Other Ambulatory Visit: Payer: Self-pay

## 2019-07-28 ENCOUNTER — Emergency Department (HOSPITAL_COMMUNITY)
Admission: EM | Admit: 2019-07-28 | Discharge: 2019-07-28 | Discharge status: Against Medical Advice | Payer: Medicare Other | Attending: Emergency Medicine | Admitting: Emergency Medicine

## 2019-07-28 ENCOUNTER — Encounter (HOSPITAL_COMMUNITY): Payer: Self-pay | Admitting: Emergency Medicine

## 2019-07-28 ENCOUNTER — Emergency Department (HOSPITAL_COMMUNITY): Payer: Medicare Other

## 2019-07-28 DIAGNOSIS — W19XXXA Unspecified fall, initial encounter: Secondary | ICD-10-CM | POA: Diagnosis not present

## 2019-07-28 DIAGNOSIS — Y999 Unspecified external cause status: Secondary | ICD-10-CM | POA: Diagnosis not present

## 2019-07-28 DIAGNOSIS — Z21 Asymptomatic human immunodeficiency virus [HIV] infection status: Secondary | ICD-10-CM | POA: Diagnosis not present

## 2019-07-28 DIAGNOSIS — Z794 Long term (current) use of insulin: Secondary | ICD-10-CM | POA: Diagnosis not present

## 2019-07-28 DIAGNOSIS — E1165 Type 2 diabetes mellitus with hyperglycemia: Secondary | ICD-10-CM | POA: Insufficient documentation

## 2019-07-28 DIAGNOSIS — Z79899 Other long term (current) drug therapy: Secondary | ICD-10-CM | POA: Insufficient documentation

## 2019-07-28 DIAGNOSIS — G44319 Acute post-traumatic headache, not intractable: Secondary | ICD-10-CM | POA: Insufficient documentation

## 2019-07-28 DIAGNOSIS — F17200 Nicotine dependence, unspecified, uncomplicated: Secondary | ICD-10-CM | POA: Diagnosis not present

## 2019-07-28 DIAGNOSIS — Y939 Activity, unspecified: Secondary | ICD-10-CM | POA: Insufficient documentation

## 2019-07-28 DIAGNOSIS — M545 Low back pain: Secondary | ICD-10-CM | POA: Diagnosis not present

## 2019-07-28 DIAGNOSIS — Z9621 Cochlear implant status: Secondary | ICD-10-CM | POA: Diagnosis not present

## 2019-07-28 DIAGNOSIS — I10 Essential (primary) hypertension: Secondary | ICD-10-CM | POA: Insufficient documentation

## 2019-07-28 DIAGNOSIS — Y929 Unspecified place or not applicable: Secondary | ICD-10-CM | POA: Diagnosis not present

## 2019-07-28 DIAGNOSIS — Z5320 Procedure and treatment not carried out because of patient's decision for unspecified reasons: Secondary | ICD-10-CM | POA: Insufficient documentation

## 2019-07-28 DIAGNOSIS — R519 Headache, unspecified: Secondary | ICD-10-CM | POA: Diagnosis present

## 2019-07-28 LAB — BASIC METABOLIC PANEL
Anion gap: 11 (ref 5–15)
BUN: 8 mg/dL (ref 6–20)
CO2: 28 mmol/L (ref 22–32)
Calcium: 9.2 mg/dL (ref 8.9–10.3)
Chloride: 101 mmol/L (ref 98–111)
Creatinine, Ser: 0.68 mg/dL (ref 0.61–1.24)
GFR calc Af Amer: >60 mL/min (ref >60)
GFR calc non Af Amer: >60 mL/min (ref >60)
Glucose, Bld: 130 mg/dL — ABNORMAL HIGH (ref 70–99)
Potassium: 3.2 mmol/L — ABNORMAL LOW (ref 3.5–5.1)
Sodium: 140 mmol/L (ref 135–145)

## 2019-07-28 LAB — CBC
HCT: 37.2 % — ABNORMAL LOW (ref 39.0–52.0)
Hemoglobin: 12.2 g/dL — ABNORMAL LOW (ref 13.0–17.0)
MCH: 31 pg (ref 26.0–34.0)
MCHC: 32.8 g/dL (ref 30.0–36.0)
MCV: 94.7 fL (ref 80.0–100.0)
Platelets: 343 10*3/uL (ref 150–400)
RBC: 3.93 MIL/uL — ABNORMAL LOW (ref 4.22–5.81)
RDW: 13.3 % (ref 11.5–15.5)
WBC: 4.7 10*3/uL (ref 4.0–10.5)
nRBC: 0 % (ref 0.0–0.2)

## 2019-07-28 LAB — CBG MONITORING, ED: Glucose-Capillary: 126 mg/dL — ABNORMAL HIGH (ref 70–99)

## 2019-07-28 NOTE — ED Provider Notes (Signed)
[facility] EMERGENCY DEPARTMENT Provider Note   CSN: [id number] Arrival date & time: 07/28/19  1722     History Chief Complaint  Patient presents with  . Hyperglycemia    Devin Rivera is a 59 y.o. male.  59 yo M with a chief complaints of headache and back pain after fall. Patient is a poor historian and is difficult to tell me when exactly this happened. He tells me that he went to [facility] for something similar. He unfortunately needs to leave frequently. Actually tells me that he is feeling like leaving and smoking outside as we are having our discussion. He is complaining of a headache and left-sided low back pain. Tells me he had a stroke recently.  The history is provided by the patient.  Hyperglycemia Associated symptoms: no abdominal pain, no chest pain, no confusion, no fever, no shortness of breath and no vomiting        Past Medical History:  Diagnosis Date  . Diabetes mellitus without complication ([place])   . HIV (human immunodeficiency virus infection) ([place])   . Hypertension     Patient Active Problem List   Diagnosis Date Noted  . Lives in homeless shelter 06/20/2019  . Weight loss, non-intentional 05/23/2019  . Type 2 diabetes mellitus without complications ([place]) 76/22/6333  . Screening examination for venereal disease 04/28/2017  . Encounter for long-term (current) use of high-risk medication 04/28/2017  . HTN (hypertension) 07/29/2016  . Tobacco abuse 07/29/2016  . HIV infection, symptomatic ([place]) 11/27/2015  . Erectile dysfunction 11/27/2015    History reviewed. No pertinent surgical history.     No family history on file.  Social History   Tobacco Use  . Smoking status: Current Every Day Smoker    Packs/day: 0.50    Start date: 03/15/1985  . Smokeless tobacco: Never Used  Substance Use Topics  . Alcohol use: Yes    Comment: occasional  . Drug use: Yes    Types: Marijuana    Comment: occasional     Home Medications Prior to Admission medications   Medication Sig Start Date End Date Taking? Authorizing Provider  amitriptyline (ELAVIL) 25 MG tablet TAKE 1 TABLET(25 MG) BY MOUTH AT BEDTIME AS NEEDED FOR SLEEP 07/24/19   Comer, Okey Regal, MD  benzonatate (TESSALON) 100 MG capsule Take by mouth 3 (three) times daily as needed for cough.    [provider]  elvitegravir-cobicistat-emtricitabine-tenofovir (GENVOYA) 150-150-200-10 MG TABS tablet Take 1 tablet by mouth daily. 05/23/19   Thayer Headings, MD  enalapril (VASOTEC) 20 MG tablet Take 1 tablet (20 mg total) by mouth daily. 07/29/16   Comer, Okey Regal, MD  hydrochlorothiazide (HYDRODIURIL) 25 MG tablet Take 25 mg by mouth daily. Unsure of dose    [provider]  insulin glargine (LANTUS) 100 UNIT/ML Solostar Pen Inject 10 Units into the skin daily. 05/23/19   Comer, Okey Regal, MD  insulin NPH-regular Human (NOVOLIN 70/30) (70-30) 100 UNIT/ML injection Inject into the skin.    [provider]  megestrol (MEGACE ES) 625 MG/5ML suspension Take 5 mLs (625 mg total) by mouth daily. 05/23/19   Comer, Okey Regal, MD  nystatin (MYCOSTATIN) 100000 UNIT/ML suspension Take 5 mLs (500,000 Units total) by mouth 4 (four) times daily. 05/14/19   Volanda Napoleon, PA-C  simethicone (GAS-X) 80 MG chewable tablet Chew 1 tablet (80 mg total) by mouth every 6 (six) hours as needed (abd pain). 11/21/15   Orpah Greek, MD  sulfamethoxazole-trimethoprim (BACTRIM) 400-80 MG tablet Take 1 tablet by mouth 2 (two) times daily. 05/24/19   Comer, Belia Heman, MD  traMADol (ULTRAM) 50 MG tablet Take 1 tablet (50 mg total) by mouth every 6 (six) hours as needed for moderate pain. 11/21/15   Gilda Crease, MD    Allergies    Glipizide and Metformin and related  Review of Systems   Review of Systems  Constitutional: Negative for chills and fever.  HENT: Negative for congestion and facial swelling.   Eyes: Negative for discharge and  visual disturbance.  Respiratory: Negative for shortness of breath.   Cardiovascular: Negative for chest pain and palpitations.  Gastrointestinal: Negative for abdominal pain, diarrhea and vomiting.  Musculoskeletal: Positive for back pain. Negative for arthralgias and myalgias.  Skin: Negative for color change and rash.  Neurological: Positive for headaches. Negative for tremors and syncope.  Psychiatric/Behavioral: Negative for confusion and dysphoric mood.    Physical Exam Updated Vital Signs BP (!) 128/99 (BP Location: Left Arm)   Pulse 100   Temp 98.6 F (37 C) (Oral)   Resp 16   SpO2 100%   Physical Exam Vitals and nursing note reviewed.  Constitutional:      Appearance: He is well-developed.  HENT:     Head: Normocephalic and atraumatic.  Eyes:     Pupils: Pupils are equal, round, and reactive to light.  Neck:     Vascular: No JVD.  Cardiovascular:     Rate and Rhythm: Normal rate and regular rhythm.     Heart sounds: No murmur. No friction rub. No gallop.   Pulmonary:     Effort: No respiratory distress.     Breath sounds: No wheezing.  Abdominal:     General: There is no distension.     Tenderness: There is no abdominal tenderness. There is no guarding or rebound.  Musculoskeletal:        General: Normal range of motion.     Cervical back: Normal range of motion and neck supple.  Skin:    Coloration: Skin is not pale.     Findings: No rash.  Neurological:     Mental Status: He is alert and oriented to person, place, and time.  Psychiatric:        Behavior: Behavior normal.     ED Results / Procedures / Treatments   Labs (all labs ordered are listed, but only abnormal results are displayed) Labs Reviewed  BASIC METABOLIC PANEL - Abnormal; Notable for the following components:      Result Value   Potassium 3.2 (*)    Glucose, Bld 130 (*)    All other components within normal limits  CBC - Abnormal; Notable for the following components:   RBC 3.93  (*)    Hemoglobin 12.2 (*)    HCT 37.2 (*)    All other components within normal limits  CBG MONITORING, ED - Abnormal; Notable for the following components:   Glucose-Capillary 126 (*)    All other components within normal limits  URINALYSIS, ROUTINE W REFLEX MICROSCOPIC    EKG None  Radiology No results found.  Procedures Procedures (including critical care time)  Medications Ordered in ED Medications - No data to display  ED Course  I have reviewed the triage vital signs and the nursing notes.  Pertinent labs & imaging results that were available during my care of the patient were reviewed by me and considered in my medical decision making (see chart for details).  MDM Rules/Calculators/A&P                      59 yo M with a chief complaint of headache and low back pain. Patient states this was from a fall that he wants me when that exactly happened. 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 Looking back at his medical record it appears that he tends not to bring his batteries so that he can hear what people are saying during his medical visits. He also tends to leave AGAINST MEDICAL ADVICE often. 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 Since then he has had a few visits in ED as one recently he actually describes his low back pain. He seems to be very poorly controlled HIV patient. I will perform a CT scan of the head as I am having difficulty obtaining history.  Patient eloped prior to reassessment.    8:39 PM:  I have discussed the diagnosis/risks/treatment options with the patient and believe the pt to be eligible for discharge home to follow-up with PCP. We also discussed returning to the ED immediately if new or worsening sx occur. We discussed the sx which are most concerning (e.g., sudden worsening pain, fever, inability  to tolerate by mouth) that necessitate immediate return. Medications administered to the patient during their visit and any new prescriptions provided to the patient are listed below.  Medications given during this visit Medications - No data to display   The patient appears reasonably screen and/or stabilized for discharge and I doubt any other medical condition or other [facility] requiring further screening, evaluation, or treatment in the ED at this time prior to discharge.   Final Clinical Impression(s) / ED Diagnoses Final diagnoses:  Acute post-traumatic headache, not intractable    Rx / DC Orders ED Discharge Orders    None       Melene Plan, DO 07/28/19 2039

## 2019-07-28 NOTE — ED Notes (Signed)
Patient seen outside smoking. Explained to patient that he had to stay in treatment area to be seen. He states "I explained I was a smoker and when the urge hits it pulled me outside." charge RN notified and patient states he would just stay outside.

## 2019-07-28 NOTE — ED Triage Notes (Signed)
Pt presents with multiple complaints. Reports he rolled out of bed this morning, hitting his head. Also reports his blood pressure and blood sugars have been elevated.

## 2019-07-29 ENCOUNTER — Other Ambulatory Visit: Payer: Self-pay

## 2019-07-29 ENCOUNTER — Emergency Department (HOSPITAL_COMMUNITY)
Admission: EM | Admit: 2019-07-29 | Discharge: 2019-07-30 | Discharge status: Against Medical Advice | Payer: Medicare Other | Source: Home / Self Care | Attending: Emergency Medicine | Admitting: Emergency Medicine

## 2019-07-29 ENCOUNTER — Emergency Department (HOSPITAL_COMMUNITY)
Admission: EM | Admit: 2019-07-29 | Discharge: 2019-07-29 | Disposition: A | Discharge status: Home / Self Care | Payer: Medicare Other | Attending: Emergency Medicine | Admitting: Emergency Medicine

## 2019-07-29 ENCOUNTER — Encounter (HOSPITAL_COMMUNITY): Payer: Self-pay | Admitting: Emergency Medicine

## 2019-07-29 ENCOUNTER — Emergency Department (HOSPITAL_COMMUNITY): Payer: Medicare Other

## 2019-07-29 ENCOUNTER — Encounter (HOSPITAL_COMMUNITY): Payer: Self-pay | Admitting: *Deleted

## 2019-07-29 DIAGNOSIS — B20 Human immunodeficiency virus [HIV] disease: Secondary | ICD-10-CM | POA: Insufficient documentation

## 2019-07-29 DIAGNOSIS — F17210 Nicotine dependence, cigarettes, uncomplicated: Secondary | ICD-10-CM | POA: Insufficient documentation

## 2019-07-29 DIAGNOSIS — S60415A Abrasion of left ring finger, initial encounter: Secondary | ICD-10-CM | POA: Insufficient documentation

## 2019-07-29 DIAGNOSIS — R519 Headache, unspecified: Secondary | ICD-10-CM | POA: Diagnosis not present

## 2019-07-29 DIAGNOSIS — Y939 Activity, unspecified: Secondary | ICD-10-CM | POA: Insufficient documentation

## 2019-07-29 DIAGNOSIS — E119 Type 2 diabetes mellitus without complications: Secondary | ICD-10-CM | POA: Insufficient documentation

## 2019-07-29 DIAGNOSIS — Z590 Homelessness: Secondary | ICD-10-CM | POA: Diagnosis not present

## 2019-07-29 DIAGNOSIS — X58XXXA Exposure to other specified factors, initial encounter: Secondary | ICD-10-CM | POA: Insufficient documentation

## 2019-07-29 DIAGNOSIS — I10 Essential (primary) hypertension: Secondary | ICD-10-CM | POA: Insufficient documentation

## 2019-07-29 DIAGNOSIS — Z794 Long term (current) use of insulin: Secondary | ICD-10-CM | POA: Insufficient documentation

## 2019-07-29 DIAGNOSIS — Z5321 Procedure and treatment not carried out due to patient leaving prior to being seen by health care provider: Secondary | ICD-10-CM | POA: Diagnosis not present

## 2019-07-29 DIAGNOSIS — Z5329 Procedure and treatment not carried out because of patient's decision for other reasons: Secondary | ICD-10-CM

## 2019-07-29 DIAGNOSIS — Y999 Unspecified external cause status: Secondary | ICD-10-CM | POA: Insufficient documentation

## 2019-07-29 DIAGNOSIS — Y929 Unspecified place or not applicable: Secondary | ICD-10-CM | POA: Insufficient documentation
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
## 2019-07-29 NOTE — ED Notes (Signed)
Witnessed patient walk outside

## 2019-07-29 NOTE — ED Notes (Signed)
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  When called please double check waiting room for patient.
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
## 2019-08-27 ENCOUNTER — Ambulatory Visit: Payer: Medicare Other | Admitting: Internal Medicine

## 2019-08-30 ENCOUNTER — Telehealth: Payer: Self-pay
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
## 2019-08-30 NOTE — Telephone Encounter (Signed)
[facility]) and Ladona Ridgel ([facility]) working with client to help stabilize him so that his health can come under better control.  They have obtained placement at [facility], but need FL2 for this placement.  Please contact Ladona Ridgel - [phone] for further information/requirements to see if this is appropriate. Patient is without primary care. Andree Coss, RN

## 2019-09-04 NOTE — Telephone Encounter (Signed)
I have filled out the FL2 and left in triage. thanks

## 2019-09-05 NOTE — Telephone Encounter (Signed)
Thanks! Copy made for scanning into patient's chart, original given to Patience to bring to [facility] at [facility].

## 2019-09-24 ENCOUNTER — Encounter: Payer: Self-pay | Admitting: Internal Medicine

## 2019-09-24 NOTE — Progress Notes (Signed)
Patient ID: Devin Rivera, male   DOB: 1960/08/13, 59 y.o.   MRN: [id number] Called patient call went to his cousin Devin Rivera he advised he is in [facility]

## 2020-02-04 ENCOUNTER — Ambulatory Visit: Payer: Medicare Other | Admitting: Internal Medicine

## 2020-02-04 ENCOUNTER — Telehealth: Payer: Self-pay

## 2020-02-04 NOTE — Telephone Encounter (Signed)
-----   Message from [facility] sent at 02/01/2020 11:12 AM EST ----- Had called to confirm appointment but was told patient has passed away.

## 2020-02-13 DEATH — deceased
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
